# Patient Record
Sex: Male | Born: 1965
Health system: Southern US, Community
[De-identification: ages and names within clinical notes are randomized; demographics above are authoritative.]

## PROBLEM LIST (undated history)

## (undated) DIAGNOSIS — H5231 Anisometropia: Secondary | ICD-10-CM

## (undated) DIAGNOSIS — M419 Scoliosis, unspecified: Secondary | ICD-10-CM

## (undated) DIAGNOSIS — G809 Cerebral palsy, unspecified: Secondary | ICD-10-CM

## (undated) DIAGNOSIS — F72 Severe intellectual disabilities: Secondary | ICD-10-CM

## (undated) DIAGNOSIS — G40909 Epilepsy, unspecified, not intractable, without status epilepticus: Secondary | ICD-10-CM

## (undated) DIAGNOSIS — G4733 Obstructive sleep apnea (adult) (pediatric): Secondary | ICD-10-CM

## (undated) DIAGNOSIS — H5210 Myopia, unspecified eye: Secondary | ICD-10-CM

## (undated) DIAGNOSIS — G8250 Quadriplegia, unspecified: Secondary | ICD-10-CM

## (undated) DIAGNOSIS — G4736 Sleep related hypoventilation in conditions classified elsewhere: Secondary | ICD-10-CM

## (undated) HISTORY — DX: Severe intellectual disabilities: F72

## (undated) HISTORY — DX: Epilepsy, unspecified, not intractable, without status epilepticus: G40.909

## (undated) HISTORY — DX: Scoliosis, unspecified: M41.9

## (undated) HISTORY — DX: Quadriplegia, unspecified: G82.50

## (undated) HISTORY — DX: Sleep related hypoventilation in conditions classified elsewhere: G47.36

## (undated) HISTORY — DX: Obstructive sleep apnea (adult) (pediatric): G47.33

## (undated) HISTORY — DX: Anisometropia: H52.31

## (undated) HISTORY — DX: Myopia, unspecified eye: H52.10

## (undated) HISTORY — DX: Cerebral palsy, unspecified: G80.9

---

## 1998-03-26 ENCOUNTER — Encounter (HOSPITAL_COMMUNITY): Admission: RE | Admit: 1998-03-26 | Discharge: 1998-06-24 | Payer: Self-pay | Admitting: Family Medicine

## 2000-03-28 ENCOUNTER — Emergency Department (HOSPITAL_COMMUNITY): Admission: EM | Admit: 2000-03-28 | Discharge: 2000-03-28 | Payer: Self-pay | Admitting: *Deleted

## 2000-03-28 ENCOUNTER — Encounter: Payer: Self-pay | Admitting: Emergency Medicine

## 2001-07-06 ENCOUNTER — Encounter: Payer: Self-pay | Admitting: Emergency Medicine

## 2001-07-06 ENCOUNTER — Emergency Department (HOSPITAL_COMMUNITY): Admission: EM | Admit: 2001-07-06 | Discharge: 2001-07-06 | Payer: Self-pay | Admitting: *Deleted

## 2001-07-06 ENCOUNTER — Encounter: Payer: Self-pay | Admitting: Diagnostic Radiology

## 2002-08-14 ENCOUNTER — Encounter: Payer: Self-pay | Admitting: Emergency Medicine

## 2002-08-14 ENCOUNTER — Emergency Department (HOSPITAL_COMMUNITY): Admission: EM | Admit: 2002-08-14 | Discharge: 2002-08-15 | Payer: Self-pay | Admitting: Emergency Medicine

## 2005-01-09 ENCOUNTER — Emergency Department (HOSPITAL_COMMUNITY): Admission: EM | Admit: 2005-01-09 | Discharge: 2005-01-09 | Payer: Self-pay | Admitting: Family Medicine

## 2005-03-30 ENCOUNTER — Emergency Department (HOSPITAL_COMMUNITY): Admission: EM | Admit: 2005-03-30 | Discharge: 2005-03-30 | Payer: Self-pay | Admitting: Emergency Medicine

## 2006-06-22 ENCOUNTER — Emergency Department (HOSPITAL_COMMUNITY): Admission: EM | Admit: 2006-06-22 | Discharge: 2006-06-22 | Payer: Self-pay | Admitting: Family Medicine

## 2007-05-18 ENCOUNTER — Emergency Department (HOSPITAL_COMMUNITY): Admission: EM | Admit: 2007-05-18 | Discharge: 2007-05-18 | Payer: Self-pay | Admitting: Family Medicine

## 2007-12-28 ENCOUNTER — Ambulatory Visit (HOSPITAL_BASED_OUTPATIENT_CLINIC_OR_DEPARTMENT_OTHER): Admission: RE | Admit: 2007-12-28 | Discharge: 2007-12-28 | Payer: Self-pay | Admitting: Family Medicine

## 2007-12-31 ENCOUNTER — Ambulatory Visit: Payer: Self-pay | Admitting: Internal Medicine

## 2008-01-19 ENCOUNTER — Ambulatory Visit: Payer: Self-pay | Admitting: Pulmonary Disease

## 2008-01-19 DIAGNOSIS — G4733 Obstructive sleep apnea (adult) (pediatric): Secondary | ICD-10-CM

## 2008-01-24 ENCOUNTER — Telehealth: Payer: Self-pay | Admitting: Pulmonary Disease

## 2008-02-09 ENCOUNTER — Encounter: Payer: Self-pay | Admitting: Pulmonary Disease

## 2008-02-21 ENCOUNTER — Encounter: Admission: RE | Admit: 2008-02-21 | Discharge: 2008-02-21 | Payer: Self-pay | Admitting: Family Medicine

## 2008-02-27 ENCOUNTER — Encounter: Payer: Self-pay | Admitting: Pulmonary Disease

## 2008-02-28 ENCOUNTER — Encounter: Payer: Self-pay | Admitting: Pulmonary Disease

## 2008-03-11 ENCOUNTER — Encounter: Payer: Self-pay | Admitting: Pulmonary Disease

## 2008-03-13 ENCOUNTER — Ambulatory Visit: Payer: Self-pay | Admitting: Pulmonary Disease

## 2008-07-06 ENCOUNTER — Ambulatory Visit: Payer: Self-pay | Admitting: Pulmonary Disease

## 2008-08-20 ENCOUNTER — Encounter: Payer: Self-pay | Admitting: Pulmonary Disease

## 2008-08-30 ENCOUNTER — Encounter: Payer: Self-pay | Admitting: Pulmonary Disease

## 2008-11-16 ENCOUNTER — Encounter: Payer: Self-pay | Admitting: Pulmonary Disease

## 2009-01-17 ENCOUNTER — Encounter: Payer: Self-pay | Admitting: Pulmonary Disease

## 2009-07-08 ENCOUNTER — Ambulatory Visit: Payer: Self-pay | Admitting: Pulmonary Disease

## 2009-07-08 DIAGNOSIS — G4736 Sleep related hypoventilation in conditions classified elsewhere: Secondary | ICD-10-CM | POA: Insufficient documentation

## 2009-07-08 DIAGNOSIS — IMO0002 Reserved for concepts with insufficient information to code with codable children: Secondary | ICD-10-CM

## 2009-07-08 HISTORY — DX: Reserved for concepts with insufficient information to code with codable children: IMO0002

## 2009-07-11 ENCOUNTER — Encounter: Payer: Self-pay | Admitting: Pulmonary Disease

## 2009-08-12 ENCOUNTER — Telehealth (INDEPENDENT_AMBULATORY_CARE_PROVIDER_SITE_OTHER): Payer: Self-pay | Admitting: *Deleted

## 2009-08-15 ENCOUNTER — Encounter: Payer: Self-pay | Admitting: Pulmonary Disease

## 2009-08-24 ENCOUNTER — Encounter: Payer: Self-pay | Admitting: Pulmonary Disease

## 2009-09-07 ENCOUNTER — Encounter: Payer: Self-pay | Admitting: Pulmonary Disease

## 2009-09-12 ENCOUNTER — Encounter: Payer: Self-pay | Admitting: Pulmonary Disease

## 2009-10-01 ENCOUNTER — Ambulatory Visit: Payer: Self-pay | Admitting: Pulmonary Disease

## 2009-10-04 ENCOUNTER — Encounter: Payer: Self-pay | Admitting: Pulmonary Disease

## 2009-10-10 ENCOUNTER — Encounter: Payer: Self-pay | Admitting: Pulmonary Disease

## 2009-10-15 ENCOUNTER — Telehealth (INDEPENDENT_AMBULATORY_CARE_PROVIDER_SITE_OTHER): Payer: Self-pay | Admitting: *Deleted

## 2009-11-04 ENCOUNTER — Telehealth (INDEPENDENT_AMBULATORY_CARE_PROVIDER_SITE_OTHER): Payer: Self-pay | Admitting: *Deleted

## 2010-01-14 ENCOUNTER — Encounter: Payer: Self-pay | Admitting: Pulmonary Disease

## 2010-02-09 ENCOUNTER — Encounter: Payer: Self-pay | Admitting: Pulmonary Disease

## 2010-03-05 ENCOUNTER — Ambulatory Visit: Payer: Self-pay | Admitting: Pulmonary Disease

## 2010-05-28 ENCOUNTER — Encounter: Payer: Self-pay | Admitting: Pulmonary Disease

## 2010-08-26 ENCOUNTER — Ambulatory Visit: Payer: Self-pay | Admitting: Pulmonary Disease

## 2010-09-03 ENCOUNTER — Encounter: Payer: Self-pay | Admitting: Pulmonary Disease

## 2010-09-10 ENCOUNTER — Encounter: Payer: Self-pay | Admitting: Pulmonary Disease

## 2010-09-23 ENCOUNTER — Telehealth (INDEPENDENT_AMBULATORY_CARE_PROVIDER_SITE_OTHER): Payer: Self-pay | Admitting: *Deleted

## 2010-10-07 ENCOUNTER — Encounter: Payer: Self-pay | Admitting: Pulmonary Disease

## 2010-10-08 ENCOUNTER — Encounter: Payer: Self-pay | Admitting: Pulmonary Disease

## 2010-11-03 ENCOUNTER — Encounter: Payer: Self-pay | Admitting: Pulmonary Disease

## 2010-11-12 ENCOUNTER — Encounter: Payer: Self-pay | Admitting: Pulmonary Disease

## 2010-11-19 ENCOUNTER — Encounter: Payer: Self-pay | Admitting: Pulmonary Disease

## 2010-11-27 NOTE — Letter (Signed)
Summary: CMN for Oxygen/[facility]  CMN for Oxygen/[facility]   Imported By: Sherian Rein 11/21/2010 07:39:07  _____________________________________________________________________  External Attachment:    Type:   Image     Comment:   External Document

## 2010-11-27 NOTE — Letter (Signed)
Summary: CMN for Oximetry Test/[facility]  CMN for Oximetry Test/[facility]   Imported By: Sherian Rein 09/08/2010 11:49:22  _____________________________________________________________________  External Attachment:    Type:   Image     Comment:   External Document

## 2010-11-27 NOTE — Progress Notes (Signed)
Summary: ORDER  Phone Note From Other Clinic Call back at [phone]   Caller: [facility] Call For: SOOD Summary of Call: HAVE QUESTIONS ABOUT PULSE OX BEING DISCONTINUED Initial call taken by: Rickard Patience,  November 04, 2009 12:47 PM  Follow-up for Phone Call        lmtcb   Philipp Deputy [facility]  November 04, 2009 1:24 PM  April returned call.  Please call her back.  Follow-up by: Eugene Gavia,  November 06, 2009 2:53 PM  Additional Follow-up for Phone Call Additional follow up Details #1::        pt only had pulse ox while having the ono done with [facility] pt does not need one all the time and dr sood did not order this for all the time Additional Follow-up by: Philipp Deputy CMA,  November 06, 2009 3:04 PM

## 2010-11-27 NOTE — Progress Notes (Signed)
Summary: overnight oximetry and sleep study  Phone Note From Other Clinic Call back at [phone]   Caller: Joyce Gross w/ [facility] for Dr. Dione Housekeeper Call For: VS Summary of Call: pt had overnight oximetry done 11.16.11.  requesting copy of this and a sleep study be faxed for their records. fax [phone] attn: Dr. Ronnie Derby.  i see the overnight oximetry but not a sleep study.   Initial call taken by: Boone Master CNA/MA,  September 23, 2010 8:56 AM  Follow-up for Phone Call        LM for Joyce Gross to call back.  Dr Evlyn Courier oroginal sleep consult note reports that pt had split night study at [facility] in 12/2007.  Not sure if she wants this study sent.  WE would have to request this from [facility]. Abigail Miyamoto RN  September 23, 2010 9:17 AM   Additional Follow-up for Phone Call Additional follow up Details #1::        Spoke with Joyce Gross at [facility].  She requested that we just send the overnight ox results.  Results faxed. Abigail Miyamoto RN  September 23, 2010 9:27 AM

## 2010-11-27 NOTE — Letter (Signed)
Summary: CMN for CPAP Supplies/[facility]  CMN for CPAP Supplies/[facility]   Imported By: Sherian Rein 02/18/2010 11:11:53  _____________________________________________________________________  External Attachment:    Type:   Image     Comment:   External Document

## 2010-11-27 NOTE — Assessment & Plan Note (Signed)
Summary: rov//mbw   Visit Type:  Follow-up Primary Provider/Referring Provider:  Dione Housekeeper  CC:  OSA. Patient is doing fairly well on CPAP. He had a new mask. He likes to sleep on his side or stomach and his eyes are red every morning due to air from the mask.Marland Kitchen  History of Present Illness: I saw Richard Oliver in follow up for his severe OSA on CPAP 17 cm H2O with 4 liters oxgen at night.  He has been sleeping well, and more energetic during the day.  His parents have noticed that he sleeps on his stomach, and they are concerned his mask gets displaced.  As a result he has air blowing in his eyes, and he wakes up with red eyes.  He is due to see the eye doctor later this month.   Current Medications (verified): 1)  Periogard 0.12 %  Soln (Chlorhexidine Gluconate) .... As Directed 2)  Fluticasone Propionate 50 Mcg/act Susp (Fluticasone Propionate) .... 2 Sprays in Each Nostril Daily 3)  Phenobarbital 100 Mg  Tabs (Phenobarbital) .Marland Kitchen.. 1 Tab Two Times A Day 4)  Xalatan 0.005 %  Soln (Latanoprost) .... As Directed For Eyes 5)  Bisacodyl 10 Mg  Supp (Bisacodyl) .... Once Daily As Needed 6)  Triazolam 0.125 Mg  Tabs (Triazolam) .Marland Kitchen.. 1 Tab By Mouth B4 Dental Appt 7)  Combigan 0.2-0.5 % Soln (Brimonidine Tartrate-Timolol) .... As Directed 8)  Deep Sea Nasal Spray 0.65 % Soln (Saline) .... As Directed 9)  Ketotifen Fumarate 0.025 % Soln (Ketotifen Fumarate) .... As Directed 10)  Chlorophyll 3-0.6 Mg Tabs (Chlorophyll-Thymol) .Marland Kitchen.. 1 By Mouth Two Times A Day  Allergies (verified): No Known Drug Allergies  Past History:  Past Medical History: Last updated: 10/01/2009 Cerebral Palsy Severe Mental Retardation Glaucoma Anisometropia Myopia Quadraplegia Scoliosis Seizure Disorder Severe OSA      - PSG 12/28/07 AHI 35      - CPAP 17 cm H2O with 4 liters oxygen  Social History: Last updated: 01/19/2008 Lives in an assisted living center.  Single, no children.  No history of alcohol or  tobacco use.  Vital Signs:  Patient profile:   45 year old male O2 Sat:      98 % on Room air Temp:     97.7 degrees F (36.50 degrees C) oral Pulse rate:   78 / minute BP sitting:   120 / 78  (left arm) Cuff size:   regular  Vitals Entered By: Michel Bickers CMA (Mar 05, 2010 9:50 AM)  O2 Sat at Rest %:  98 O2 Flow:  Room air  Physical Exam  Eyes:  conjunctival injection.   Nose:  Narrow nasal angle, no drainage Mouth:  Retrognathic, high arched palate, narrow jaw, enlarged tongue Neck:  no LAN Lungs:  clear bilaterally to auscultation and percussion Heart:  regular rate and rhythm, S1, S2 without murmurs, rubs, gallops, or clicks Extremities:  no clubbing, cyanosis, edema, or deformity noted   Impression & Recommendations:  Problem # 1:  OBSTRUCTIVE SLEEP APNEA (ICD-327.23) Will continue CPAP 17 cm.  Problem # 2:  HYPOXEMIA (ICD-799.02) He is to continue on 4 liters O2 at night with CPAP.  Medications Added to Medication List This Visit: 1)  Bisacodyl 10 Mg Supp (Bisacodyl) .... Once daily as needed  Complete Medication List: 1)  Periogard 0.12 % Soln (Chlorhexidine gluconate) .... As directed 2)  Fluticasone Propionate 50 Mcg/act Susp (Fluticasone propionate) .... 2 sprays in each nostril daily 3)  Phenobarbital 100 Mg  Tabs (Phenobarbital) .Marland Kitchen.. 1 tab two times a day 4)  Xalatan 0.005 % Soln (Latanoprost) .... As directed for eyes 5)  Bisacodyl 10 Mg Supp (Bisacodyl) .... Once daily as needed 6)  Triazolam 0.125 Mg Tabs (Triazolam) .Marland Kitchen.. 1 tab by mouth b4 dental appt 7)  Combigan 0.2-0.5 % Soln (Brimonidine tartrate-timolol) .... As directed 8)  Deep Sea Nasal Spray 0.65 % Soln (Saline) .... As directed 9)  Ketotifen Fumarate 0.025 % Soln (Ketotifen fumarate) .... As directed 10)  Chlorophyll 3-0.6 Mg Tabs (Chlorophyll-thymol) .Marland Kitchen.. 1 by mouth two times a day  Other Orders: Est. Patient Level III (29562) DME Referral (DME)  Patient Instructions: 1)  Will have home  care company check CPAP mask fit 2)  Follow up in 6 months

## 2010-11-27 NOTE — Procedures (Signed)
Summary: Oximetry / [facility]  Oximetry / [facility]   Imported By: Lennie Odor 09/22/2010 12:44:35  _____________________________________________________________________  External Attachment:    Type:   Image     Comment:   External Document

## 2010-11-27 NOTE — Letter (Signed)
Summary: CMN for Supplies/[facility]  CMN for Supplies/[facility]   Imported By: Lennie Odor 06/03/2010 14:34:28  _____________________________________________________________________  External Attachment:    Type:   Image     Comment:   External Document

## 2010-11-27 NOTE — Letter (Signed)
Summary: CMN/[facility]  CMN/[facility]   Imported By: Lester Bexar 09/22/2010 09:44:33  _____________________________________________________________________  External Attachment:    Type:   Image     Comment:   External Document

## 2010-11-27 NOTE — Letter (Signed)
Summary: CMN for CPAP Supplies/[facility]  CMN for CPAP Supplies/[facility]   Imported By: Sherian Rein 10/15/2010 09:39:09  _____________________________________________________________________  External Attachment:    Type:   Image     Comment:   External Document

## 2010-11-27 NOTE — Letter (Signed)
Summary: CMN for Pulse Oximeter/[facility]  CMN for Pulse Oximeter/[facility]   Imported By: Sherian Rein 01/17/2010 12:04:23  _____________________________________________________________________  External Attachment:    Type:   Image     Comment:   External Document

## 2010-11-27 NOTE — Progress Notes (Signed)
Summary: waiting to hear re: ono w/ [facility]  Phone Note From Other Clinic   Caller: kay w/ [facility] Call For: sood Summary of Call: caller says that they have not yet heard from [facility] re: ono to be set up. call either trian or mena (nurses at [facility] w/ info after talking to [facility] and advise. [phone] Initial call taken by: Tivis Ringer,  August 12, 2009 3:25 PM  Follow-up for Phone Call        [facility] checking on this and will call back.Michel Bickers [facility]  August 12, 2009 3:54 PM  Per Dennard Schaumann at [facility], order was never received at their office. I will send this to our PCC's to have this resent to [facility] and let [facility] know this also. Libby or [place], [facility] did not get this order. Please resend. Thanks. Follow-up by: Michel Bickers CMA,  August 12, 2009 4:10 PM  Additional Follow-up for Phone Call Additional follow up Details #1::        spoke to lecretia she will check to see what happened to the order in sept and i will give her a copy to flu with  Additional Follow-up by: Oneita Jolly,  August 12, 2009 4:54 PM

## 2010-11-27 NOTE — Letter (Signed)
Summary: CMN for Oxygen / [facility]  CMN for Oxygen / [facility]   Imported By: Lennie Odor 11/11/2010 11:30:06  _____________________________________________________________________  External Attachment:    Type:   Image     Comment:   External Document

## 2010-11-27 NOTE — Assessment & Plan Note (Signed)
Summary: f/u ///kp   Visit Type:  Follow-up Primary Aveyah Greenwood/Referring Ashelynn Marks:  Dione Housekeeper  CC:  OSA follow-up...mask is fitting well...patient says he did not sleep well last night.  History of Present Illness: I saw Mr. Haberman in follow up for his severe OSA on CPAP 17 cm H2O with 4 liters oxgen at night.  He has been doing better since his mask was refit.  He uses his mask on most nights, and uses oxygen with this.  He seems alert according to the staff at his home.  He has not complained of sinus congestion, sore throat, or cough.  Current Medications (verified): 1)  Periogard 0.12 %  Soln (Chlorhexidine Gluconate) .... As Directed 2)  Fluticasone Propionate 50 Mcg/act Susp (Fluticasone Propionate) .... 2 Sprays in Each Nostril Daily 3)  Phenobarbital 100 Mg  Tabs (Phenobarbital) .Marland Kitchen.. 1 Tab Two Times A Day 4)  Xalatan 0.005 %  Soln (Latanoprost) .... As Directed For Eyes 5)  Bisacodyl 10 Mg  Supp (Bisacodyl) .... Once Daily As Needed 6)  Triazolam 0.125 Mg  Tabs (Triazolam) .Marland Kitchen.. 1 Tab By Mouth B4 Dental Appt 7)  Deep Sea Nasal Spray 0.65 % Soln (Saline) .... As Directed 8)  Ketotifen Fumarate 0.025 % Soln (Ketotifen Fumarate) .... As Directed 9)  Chlorophyll 3-0.6 Mg Tabs (Chlorophyll-Thymol) .Marland Kitchen.. 1 By Mouth Two Times A Day 10)  Tinactin 1 % Aero (Tolnaftate) .... As Directed 11)  Genteal Pm 85-15 % Oint (White Petrolatum-Mineral Oil) .... As Directed At Bedtime 12)  Zyrtec Allergy 10 Mg Caps (Cetirizine Hcl) .Marland Kitchen.. 1 By Mouth Daily  Allergies (verified): No Known Drug Allergies  Past History:  Past Medical History: Reviewed history from 10/01/2009 and no changes required. Cerebral Palsy Severe Mental Retardation Glaucoma Anisometropia Myopia Quadraplegia Scoliosis Seizure Disorder Severe OSA      - PSG 12/28/07 AHI 35      - CPAP 17 cm H2O with 4 liters oxygen  Vital Signs:  Patient profile:   45 year old male Height:      62 inches (157.48 cm) O2 Sat:      98  % on Room air Temp:     98.0 degrees F (36.67 degrees C) oral Pulse rate:   81 / minute BP sitting:   124 / 86  (right arm) Cuff size:   regular  Vitals Entered By: Michel Bickers CMA (August 26, 2010 8:52 AM)  O2 Sat at Rest %:  98 O2 Flow:  Room air CC: OSA follow-up...mask is fitting well...patient says he did not sleep well last night Comments Medications reviewed with patient Michel Bickers CMA  August 26, 2010 9:00 AM   Physical Exam  Nose:  Narrow nasal angle, no drainage Mouth:  Retrognathic, high arched palate, narrow jaw, enlarged tongue Neck:  no LAN Lungs:  clear bilaterally to auscultation and percussion Heart:  regular rate and rhythm, S1, S2 without murmurs, rubs, gallops, or clicks Extremities:  no clubbing, cyanosis, edema, or deformity noted   Impression & Recommendations:  Problem # 1:  OBSTRUCTIVE SLEEP APNEA (ICD-327.23)  Will continue CPAP 17 cm.  Problem # 2:  HYPOXEMIA (ICD-799.02)  He is to continue on 4 liters O2 at night with CPAP.  Medications Added to Medication List This Visit: 1)  Tinactin 1 % Aero (Tolnaftate) .... As directed 2)  Genteal Pm 85-15 % Oint (White petrolatum-mineral oil) .... As directed at bedtime 3)  Zyrtec Allergy 10 Mg Caps (Cetirizine hcl) .Marland Kitchen.. 1 by mouth  daily  Complete Medication List: 1)  Periogard 0.12 % Soln (Chlorhexidine gluconate) .... As directed 2)  Fluticasone Propionate 50 Mcg/act Susp (Fluticasone propionate) .... 2 sprays in each nostril daily 3)  Phenobarbital 100 Mg Tabs (Phenobarbital) .Marland Kitchen.. 1 tab two times a day 4)  Xalatan 0.005 % Soln (Latanoprost) .... As directed for eyes 5)  Bisacodyl 10 Mg Supp (Bisacodyl) .... Once daily as needed 6)  Triazolam 0.125 Mg Tabs (Triazolam) .Marland Kitchen.. 1 tab by mouth b4 dental appt 7)  Deep Sea Nasal Spray 0.65 % Soln (Saline) .... As directed 8)  Ketotifen Fumarate 0.025 % Soln (Ketotifen fumarate) .... As directed 9)  Chlorophyll 3-0.6 Mg Tabs (Chlorophyll-thymol) .Marland Kitchen.. 1  by mouth two times a day 10)  Tinactin 1 % Aero (Tolnaftate) .... As directed 11)  Genteal Pm 85-15 % Oint (White petrolatum-mineral oil) .... As directed at bedtime 12)  Zyrtec Allergy 10 Mg Caps (Cetirizine hcl) .Marland Kitchen.. 1 by mouth daily  Other Orders: Est. Patient Level III (16109)  Patient Instructions: 1)  Follow up in one year

## 2010-11-27 NOTE — Procedures (Signed)
Summary: Oximetry/[facility]  Oximetry/[facility] [place]   Imported By: Sherian Rein 11/14/2010 10:49:24  _____________________________________________________________________  External Attachment:    Type:   Image     Comment:   External Document

## 2010-11-27 NOTE — Letter (Signed)
Summary: CMN for CPAP Supplies/[facility]  CMN for CPAP Supplies/[facility]   Imported By: Sherian Rein 10/10/2010 14:06:40  _____________________________________________________________________  External Attachment:    Type:   Image     Comment:   External Document

## 2010-12-03 NOTE — Letter (Signed)
Summary: CMN/[facility]  CMN/[facility]   Imported By: Lester York 11/26/2010 10:51:04  _____________________________________________________________________  External Attachment:    Type:   Image     Comment:   External Document

## 2011-03-10 NOTE — Procedures (Signed)
NAME:  Richard Oliver, Richard Oliver                 ACCOUNT NO.:  [id number]   MEDICAL RECORD NO.:  [id number]          PATIENT TYPE:  OUT   LOCATION:  SLEEP CENTER                 FACILITY:  [facility]   PHYSICIAN:  Clinton D. Maple Hudson, MD, FCCP, FACPDATE OF BIRTH:  Jul 12, 1968   DATE OF STUDY:  12/28/2007                            NOCTURNAL POLYSOMNOGRAM   REFERRING PHYSICIAN:  Dione Housekeeper, M.D.   INDICATION FOR STUDY:  Hypersomnia with sleep apnea.   EPWORTH SLEEPINESS SCORE:  14/24, BMI 27.3, weight 149 pounds, height 62  inches. Neck 16 inches.   MEDICATIONS:  Home medication charted and reviewed.  Note - this patient  has cerebral palsy, seizure disorder and required one on one assistance.   SLEEP ARCHITECTURE:  Total sleep time 365 minutes with sleep efficiency  98%.  Stage 1 was 3.7%, stage II 89.3%, stage III absent, REM 7% of  total sleep time.  Sleep latency 1 minute, REM latency 239 minutes, awake after sleep onset  6.5 minutes, arousal index 15.6.  No bedtime medication taken.   RESPIRATORY DATA:  Apnea/hypopnea index (AHI) 34.8 indicating moderately  severe obstructive sleep apnea/hypopnea syndrome.  There were 212 events  scored, including 154 obstructive apnea's, 12 central apnea's, and 46  hypopnea's.  Events were not positional but more common while not  supine. REM AHI 7.1.  CPAP was titrated to 17 CWP, AHI 4.7 per hour.  A  medium Quattro full face mask was used with heated humidifier.   OXYGEN DATA:  Loud snoring with oxygen desaturation to a nadir of 74%  before CPAP. Mean oxygen saturation was held at 91.9% on room air.   CARDIAC DATA:  Normal sinus rhythm.   MOVEMENT-PARASOMNIA:  No limb movement disturbance, no bathroom trips.   IMPRESSIONS-RECOMMENDATIONS:  1. Moderately severe obstructive sleep apnea/hypopnea syndrome, AHI      34.8 overall and 56.2 before CPAP with loud snoring and oxygen      desaturation to a nadir of 74%.  2. Successful CPAP titration to 17 CWP,  AHI 4.7 per hour.  A medium      Quattro full face mask was used with heated humidifier.      Clinton D. Maple Hudson, MD, [facility], FACP  Diplomate, Biomedical engineer of Sleep Medicine  Electronically Signed     CDY/MEDQ  D:  12/31/2007 16:53:39  T:  01/01/2008 14:39:27  Job:  [id number]

## 2011-04-09 ENCOUNTER — Telehealth: Payer: Self-pay | Admitting: Pulmonary Disease

## 2011-04-09 DIAGNOSIS — G4733 Obstructive sleep apnea (adult) (pediatric): Secondary | ICD-10-CM

## 2011-04-09 NOTE — Telephone Encounter (Signed)
[email or web address].Pebble Botkin, CMA  

## 2011-04-10 ENCOUNTER — Telehealth: Payer: Self-pay | Admitting: Pulmonary Disease

## 2011-04-10 DIAGNOSIS — G4733 Obstructive sleep apnea (adult) (pediatric): Secondary | ICD-10-CM

## 2011-04-10 NOTE — Telephone Encounter (Signed)
Noted  

## 2011-04-10 NOTE — Telephone Encounter (Signed)
Spoke with Nurse at [facility] care ctr. She states that pt having difficulty with being compliant with CPAP. He is c/o mask hurting his nose, too much pressure from CPAP- set on 19 currently. He sleeps on his knees facing down in fetal position and this does not seem to helps matters. VS out of the office until 04/22/11 so will forward to RA. Please advise thanks!

## 2011-04-10 NOTE — Telephone Encounter (Signed)
Called and spoke with nurse,TreeAnne, and informed her of RA's recs.  Order sent to [facility] to have pt's DME company, [facility], adjust pressure and get download in 2 weeks.

## 2011-04-10 NOTE — Telephone Encounter (Signed)
Lower pressure to 15 cm  Get download in 2 weeks for VS.

## 2011-04-13 NOTE — Telephone Encounter (Signed)
Pt currently on CPAP set on 17 with 4l/m oxygen; per CY-okay to turn CPAP down to 15 with 4l/m oxygen. I spoke with nurse-aware order has been placed to have [facility] turn CPAP down.

## 2011-04-13 NOTE — Telephone Encounter (Signed)
Spoke with nurse-states that she has changed the mask for the patient but he feels like it may be the pressure as he isn't keeping the mask on. Was wondering if pressure can be checked through [facility]. VS out of the office until 04-22-11; please advise.

## 2011-04-14 ENCOUNTER — Other Ambulatory Visit: Payer: Self-pay | Admitting: Pulmonary Disease

## 2011-04-14 DIAGNOSIS — G4733 Obstructive sleep apnea (adult) (pediatric): Secondary | ICD-10-CM

## 2011-09-21 ENCOUNTER — Encounter: Payer: Self-pay | Admitting: Pulmonary Disease

## 2011-09-22 ENCOUNTER — Ambulatory Visit (INDEPENDENT_AMBULATORY_CARE_PROVIDER_SITE_OTHER): Payer: Medicare Other | Admitting: Pulmonary Disease

## 2011-09-22 ENCOUNTER — Encounter: Payer: Self-pay | Admitting: Pulmonary Disease

## 2011-09-22 DIAGNOSIS — G4733 Obstructive sleep apnea (adult) (pediatric): Secondary | ICD-10-CM

## 2011-09-22 DIAGNOSIS — G4736 Sleep related hypoventilation in conditions classified elsewhere: Secondary | ICD-10-CM

## 2011-09-22 DIAGNOSIS — G4739 Other sleep apnea: Secondary | ICD-10-CM

## 2011-09-22 NOTE — Assessment & Plan Note (Signed)
He is to continue with 4 liters oxygen with CPAP.     

## 2011-09-22 NOTE — Assessment & Plan Note (Signed)
He is to continue using CPAP at 17 cm H2O.  Advised he could try using an adhesive strip over his nose at night with CPAP mask.

## 2011-09-22 NOTE — Patient Instructions (Signed)
Follow up in 6 months 

## 2011-09-22 NOTE — Progress Notes (Signed)
Chief Complaint  Patient presents with  . Follow-up    Pt wears cpap everynight w/ oxygen at night. Pt states the mask makes marks on his nose.    History of Present Illness: Richard Oliver is a 45 y.o. male with severe OSA/OHS on CPAP 17 cm H2O with 4 liters oxygen.  He has some irritation over his nose from using CPAP.    His caregiver reports that he uses his CPAP and oxygen on a regular basis.  He is alert during the day, and able to participate in group home activities w/o complaints of falling asleep during the day.  He has pulse oximetry done at night while using oxygen and CPAP, and there have been no reports of trouble with his set up.  Past Medical History  Diagnosis Date  . Cerebral palsy   . Severe mental retardation   . Glaucoma   . Anisometropia   . Myopia   . Quadriplegia   . Scoliosis   . Seizure disorder   . OSA (obstructive sleep apnea)   . Sleep-related hypoventilation 07/08/2009    No past surgical history on file.  Current Outpatient Prescriptions on File Prior to Visit  Medication Sig Dispense Refill  . bisacodyl (DULCOLAX) 10 MG suppository Place 10 mg rectally as needed.        . chlorhexidine (PERIDEX) 0.12 % solution as directed.        . Chlorophyll 3-0.6 MG TABS Take 1 tablet by mouth 2 (two) times daily.        . fluticasone (FLONASE) 50 MCG/ACT nasal spray Place 2 sprays into the nose daily.        Marland Kitchen ketotifen (ZADITOR) 0.025 % ophthalmic solution as directed.        . latanoprost (XALATAN) 0.005 % ophthalmic solution as directed.        . sodium chloride (OCEAN) 0.65 % nasal spray as directed.        Cliffton Asters Petrolatum-Mineral Oil (GENTEAL PM) 85-15 % OINT Apply to eye as directed.          No Known Allergies  Physical Exam:  Blood pressure 122/76, pulse 76, temperature 98 F (36.7 C), temperature source Oral, height 5\' 2"  (1.575 m), weight 157 lb (71.215 kg), SpO2 97.00%.  General - In wheelchair HEENT - Wear glasses, mild irritation over  bridge of nose, narrow nasal angle, retrognathic, high arched palate, enlarged tongue Cardiac - s1s2 regular, no murmur Chest - no wheeze/rales Abdomen - soft, nontender Extremities - no edema Skin - no rashes Neurologic - follows simple commands   Assessment/Plan:  OBSTRUCTIVE SLEEP APNEA He is to continue using CPAP at 17 cm H2O.  Advised he could try using an adhesive strip over his nose at night with CPAP mask.  Sleep-related hypoventilation He is to continue with 4 liters oxygen with CPAP.     Outpatient Encounter Prescriptions as of 09/22/2011  Medication Sig Dispense Refill  . bisacodyl (DULCOLAX) 10 MG suppository Place 10 mg rectally as needed.        . brimonidine (ALPHAGAN) 0.15 % ophthalmic solution 1 drop right eye twice a day       . chlorhexidine (PERIDEX) 0.12 % solution as directed.        . Chlorophyll 3-0.6 MG TABS Take 1 tablet by mouth 2 (two) times daily.        Marland Kitchen docusate sodium (COLACE) 50 MG capsule 1 every evening       . fluticasone (FLONASE) 50  MCG/ACT nasal spray Place 2 sprays into the nose daily.        Marland Kitchen ketotifen (ZADITOR) 0.025 % ophthalmic solution as directed.        . latanoprost (XALATAN) 0.005 % ophthalmic solution as directed.        . Multiple Vitamin (MULTIVITAMIN) tablet Take 1 tablet by mouth daily.        . Polyethylene Glycol 400 (BLINK TEARS OP) 1 drop both eyes 4 times a day as needed       . sodium chloride (OCEAN) 0.65 % nasal spray as directed.        . sulfamethoxazole-trimethoprim (BACTRIM DS) 800-160 MG per tablet Take 1 tablet by mouth 2 (two) times daily.        . timolol (BETIMOL) 0.5 % ophthalmic solution 1 drop right eye every morning       . White Petrolatum-Mineral Oil (GENTEAL PM) 85-15 % OINT Apply to eye as directed.        Marland Kitchen DISCONTD: cetirizine (ZYRTEC) 10 MG tablet Take 10 mg by mouth daily.        Marland Kitchen DISCONTD: PHENObarbital (LUMINAL) 100 MG tablet Take 100 mg by mouth 2 (two) times daily.        Marland Kitchen DISCONTD:  tolnaftate (TINACTIN) 1 % spray as directed.        Marland Kitchen DISCONTD: triazolam (HALCION) 0.125 MG tablet Take 0.125 mg by mouth as directed. Before dental appointment         Richard Oliver Pager:  [phone] 09/22/2011, 9:49 AM

## 2012-03-14 ENCOUNTER — Ambulatory Visit: Payer: Medicare Other | Admitting: Pulmonary Disease

## 2012-04-01 ENCOUNTER — Ambulatory Visit (INDEPENDENT_AMBULATORY_CARE_PROVIDER_SITE_OTHER): Payer: Medicare Other | Admitting: Pulmonary Disease

## 2012-04-01 ENCOUNTER — Encounter: Payer: Self-pay | Admitting: Pulmonary Disease

## 2012-04-01 VITALS — BP 138/92 | HR 73 | Temp 97.5°F | Ht 65.0 in

## 2012-04-01 DIAGNOSIS — G4739 Other sleep apnea: Secondary | ICD-10-CM

## 2012-04-01 DIAGNOSIS — G4733 Obstructive sleep apnea (adult) (pediatric): Secondary | ICD-10-CM

## 2012-04-01 DIAGNOSIS — G4736 Sleep related hypoventilation in conditions classified elsewhere: Secondary | ICD-10-CM

## 2012-04-01 NOTE — Assessment & Plan Note (Signed)
He is to continue using CPAP at 17 cm H2O.  Will have his DME adjust his mask.

## 2012-04-01 NOTE — Patient Instructions (Signed)
Will have your home care company re-assess CPAP mask fit Follow up in one year

## 2012-04-01 NOTE — Assessment & Plan Note (Signed)
He is to continue with 4 liters oxygen with CPAP.     

## 2012-04-01 NOTE — Progress Notes (Signed)
Chief Complaint  Patient presents with  . Follow-up    According to pt he does not wear his cpap mask everynight. but does wears his oxygen everynight    History of Present Illness: Campbell Kray is a 46 y.o. male with severe OSA/OHS on CPAP 17 cm H2O with 4 liters oxygen.  He reports using his CPAP intermittently.  He does not like the way the mask fits over his nose.  He is using his oxygen.  His companion does not work at his home, and is not able to provide additional information about his sleep.  Past Medical History  Diagnosis Date  . Cerebral palsy   . Severe mental retardation   . Glaucoma   . Anisometropia   . Myopia   . Quadriplegia   . Scoliosis   . Seizure disorder   . OSA (obstructive sleep apnea)   . Sleep-related hypoventilation 07/08/2009    No past surgical history on file.  Current Outpatient Prescriptions on File Prior to Visit  Medication Sig Dispense Refill  . bisacodyl (DULCOLAX) 10 MG suppository Place 10 mg rectally as needed.        . brimonidine (ALPHAGAN) 0.15 % ophthalmic solution 1 drop right eye twice a day       . chlorhexidine (PERIDEX) 0.12 % solution as directed.        . Chlorophyll 3-0.6 MG TABS Take 1 tablet by mouth 2 (two) times daily.        Marland Kitchen docusate sodium (COLACE) 50 MG capsule 1 every evening       . fluticasone (FLONASE) 50 MCG/ACT nasal spray Place 2 sprays into the nose daily.        Marland Kitchen ketotifen (ZADITOR) 0.025 % ophthalmic solution as directed.        . latanoprost (XALATAN) 0.005 % ophthalmic solution as directed.        . Multiple Vitamin (MULTIVITAMIN) tablet Take 1 tablet by mouth daily.        Marland Kitchen PHENobarbital (LUMINAL) 97.2 MG tablet Take 97.2 mg by mouth 2 (two) times daily.      . Polyethylene Glycol 400 (BLINK TEARS OP) 1 drop both eyes 4 times a day as needed       . sodium chloride (OCEAN) 0.65 % nasal spray as directed.        . timolol (BETIMOL) 0.5 % ophthalmic solution 1 drop right eye every morning       . White  Petrolatum-Mineral Oil (GENTEAL PM) 85-15 % OINT Apply to eye as directed.          No Known Allergies  Physical Exam:  Blood pressure 138/92, pulse 73, temperature 97.5 F (36.4 C), temperature source Oral, height 5\' 5"  (1.651 m), SpO2 95.00%.  General - In wheelchair HEENT - narrow nasal angle, retrognathic, high arched palate, enlarged tongue Cardiac - s1s2 regular, no murmur Chest - no wheeze/rales Abdomen - soft, nontender Extremities - no edema Skin - no rashes Neurologic - follows simple commands   Assessment/Plan:  Devonne Lalani Pager:  [phone] 04/01/2012, 11:13 AM

## 2013-01-16 ENCOUNTER — Telehealth: Payer: Self-pay | Admitting: Pulmonary Disease

## 2013-01-16 DIAGNOSIS — E662 Morbid (severe) obesity with alveolar hypoventilation: Secondary | ICD-10-CM

## 2013-01-16 DIAGNOSIS — G4733 Obstructive sleep apnea (adult) (pediatric): Secondary | ICD-10-CM

## 2013-01-16 NOTE — Telephone Encounter (Signed)
I spoke with Melissa. RT went out to pt home to troubleshoot pt CPAP. RT states it is the motor getting ready to go out. Pt is due for new machine 01/23/13. They will need new order for machine/supplies and will need OV per medicare guidelines. Please advise Dr. Craige Cotta. Nothing available until mid April. thanks

## 2013-01-17 NOTE — Telephone Encounter (Signed)
I spoke with kay. She schedules pt appts since he is in a home and he is scheduled for 02/02/13 at 9:15. Nothing further was needed

## 2013-01-17 NOTE — Telephone Encounter (Signed)
lmomtcb x1 for pt 

## 2013-01-17 NOTE — Telephone Encounter (Signed)
Please inform pt that I have placed order for new machine, but that he may not be able to get new machine until after ROV.  Please schedule him for next available ROV in April.

## 2013-02-02 ENCOUNTER — Encounter: Payer: Self-pay | Admitting: Pulmonary Disease

## 2013-02-02 ENCOUNTER — Ambulatory Visit (INDEPENDENT_AMBULATORY_CARE_PROVIDER_SITE_OTHER): Payer: Medicare Other | Admitting: Pulmonary Disease

## 2013-02-02 VITALS — BP 118/74 | HR 80 | Temp 96.9°F | Wt 159.0 lb

## 2013-02-02 DIAGNOSIS — G4736 Sleep related hypoventilation in conditions classified elsewhere: Secondary | ICD-10-CM

## 2013-02-02 DIAGNOSIS — G4733 Obstructive sleep apnea (adult) (pediatric): Secondary | ICD-10-CM

## 2013-02-02 DIAGNOSIS — G4739 Other sleep apnea: Secondary | ICD-10-CM

## 2013-02-02 NOTE — Patient Instructions (Signed)
Will arrange for new CPAP machine and CPAP mask refit Follow up in 6 months

## 2013-02-02 NOTE — Progress Notes (Signed)
Chief Complaint  Patient presents with  . Follow-up    Pt wears it everynight but take its off every now and then. he also wears oxyegn at night as well.     History of Present Illness: Richard Oliver is a 47 y.o. male severe OSA/OHS on CPAP 17 cm H2O with 4 liters oxygen.  He has trouble using his machine.  He does not like the noise it makes.  He also c/o soreness from his mask.  He is accompanied by attendant from his residential facility.  TESTS: PSG 12/28/07 >> AHI 35   Richard Oliver  has a past medical history of Cerebral palsy; Severe mental retardation; Glaucoma(365); Anisometropia; Myopia; Quadriplegia; Scoliosis; Seizure disorder; OSA (obstructive sleep apnea); and Sleep-related hypoventilation (07/08/2009).  Richard Oliver  has no past surgical history on file.  Prior to Admission medications   Medication Sig Start Date End Date Taking? Authorizing Provider  bisacodyl (DULCOLAX) 10 MG suppository Place 10 mg rectally as needed.     Yes Historical Provider, MD  brimonidine (ALPHAGAN) 0.15 % ophthalmic solution 1 drop right eye twice a day    Yes Historical Provider, MD  chlorhexidine (PERIDEX) 0.12 % solution as directed.     Yes Historical Provider, MD  Chlorophyll 3-0.6 MG TABS Take 1 tablet by mouth 2 (two) times daily.     Yes Historical Provider, MD  docusate sodium (COLACE) 50 MG capsule 1 every evening    Yes Historical Provider, MD  fluticasone (FLONASE) 50 MCG/ACT nasal spray Place 2 sprays into the nose daily.     Yes Historical Provider, MD  ketotifen (ZADITOR) 0.025 % ophthalmic solution as directed.     Yes Historical Provider, MD  latanoprost (XALATAN) 0.005 % ophthalmic solution as directed.     Yes Historical Provider, MD  Multiple Vitamin (MULTIVITAMIN) tablet Take 1 tablet by mouth daily.     Yes Historical Provider, MD  PHENobarbital (LUMINAL) 97.2 MG tablet Take 97.2 mg by mouth 2 (two) times daily.   Yes Historical Provider, MD  Polyethylene Glycol 400 (BLINK  TEARS OP) 1 drop both eyes 4 times a day as needed    Yes Historical Provider, MD  sodium chloride (OCEAN) 0.65 % nasal spray as directed.     Yes Historical Provider, MD  timolol (BETIMOL) 0.5 % ophthalmic solution 1 drop right eye every morning    Yes Historical Provider, MD  White Petrolatum-Mineral Oil (GENTEAL PM) 85-15 % OINT Apply to eye as directed.     Yes Historical Provider, MD    No Known Allergies   Physical Exam:  General - in wheelchair ENT - Narrow nasal angle, retrognathic, high arched palate, enlarged tongue Cardiac - s1s2 regular, no murmur Chest - No wheeze/rales/dullness Back - No focal tenderness Abd - Soft, non-tender Ext - No edema Neuro - Normal strength Skin - No rashes Psych - normal mood, and behavior   Assessment/Plan:  Richard Helling, MD [place] [facility]/Critical Care/Sleep Pager:  [phone]

## 2013-02-02 NOTE — Assessment & Plan Note (Signed)
He does not like the noise his machine has been making, and this wakes him up.  He also has trouble with his CPAP mask.  Will arrange for new CPAP machine and get download after he uses new machine for 1 month.  Will also arrange mask refit.

## 2013-02-02 NOTE — Assessment & Plan Note (Signed)
He is to continue with 4 liters oxygen with CPAP.

## 2013-08-10 ENCOUNTER — Ambulatory Visit (INDEPENDENT_AMBULATORY_CARE_PROVIDER_SITE_OTHER): Payer: Medicare Other | Admitting: Pulmonary Disease

## 2013-08-10 ENCOUNTER — Encounter: Payer: Self-pay | Admitting: Pulmonary Disease

## 2013-08-10 VITALS — BP 122/84 | HR 84 | Wt 154.0 lb

## 2013-08-10 DIAGNOSIS — G4736 Sleep related hypoventilation in conditions classified elsewhere: Secondary | ICD-10-CM

## 2013-08-10 DIAGNOSIS — G4739 Other sleep apnea: Secondary | ICD-10-CM

## 2013-08-10 DIAGNOSIS — G4733 Obstructive sleep apnea (adult) (pediatric): Secondary | ICD-10-CM

## 2013-08-10 NOTE — Assessment & Plan Note (Signed)
He is doing well with CPAP 17 cm H2O.  Will get his download.

## 2013-08-10 NOTE — Patient Instructions (Signed)
Follow up in 6 months 

## 2013-08-10 NOTE — Assessment & Plan Note (Signed)
He is to continue 4 liters oxygen at night with CPAP.

## 2013-08-10 NOTE — Progress Notes (Signed)
Chief Complaint  Patient presents with  . Follow-up    History of Present Illness: Richard Oliver is a 47 y.o. male severe OSA/OHS on CPAP 17 cm H2O with 4 liters oxygen.  He has been doing better since getting a new machine.  His new mask fits much better.  He is accompanied by attendant from his residential facility.  She reports that he is alert during the day.  TESTS: PSG 12/28/07 >> AHI 35   Richard Oliver  has a past medical history of Cerebral palsy; Severe mental retardation; Glaucoma; Anisometropia; Myopia; Quadriplegia; Scoliosis; Seizure disorder; OSA (obstructive sleep apnea); and Sleep-related hypoventilation (07/08/2009).  Richard Oliver  has no past surgical history on file.  Prior to Admission medications   Medication Sig Start Date End Date Taking? Authorizing Provider  bisacodyl (DULCOLAX) 10 MG suppository Place 10 mg rectally as needed.     Yes Historical Provider, MD  brimonidine (ALPHAGAN) 0.15 % ophthalmic solution 1 drop right eye twice a day    Yes Historical Provider, MD  chlorhexidine (PERIDEX) 0.12 % solution as directed.     Yes Historical Provider, MD  Chlorophyll 3-0.6 MG TABS Take 1 tablet by mouth 2 (two) times daily.     Yes Historical Provider, MD  docusate sodium (COLACE) 50 MG capsule 1 every evening    Yes Historical Provider, MD  fluticasone (FLONASE) 50 MCG/ACT nasal spray Place 2 sprays into the nose daily.     Yes Historical Provider, MD  ketotifen (ZADITOR) 0.025 % ophthalmic solution as directed.     Yes Historical Provider, MD  latanoprost (XALATAN) 0.005 % ophthalmic solution as directed.     Yes Historical Provider, MD  Multiple Vitamin (MULTIVITAMIN) tablet Take 1 tablet by mouth daily.     Yes Historical Provider, MD  PHENobarbital (LUMINAL) 97.2 MG tablet Take 97.2 mg by mouth 2 (two) times daily.   Yes Historical Provider, MD  Polyethylene Glycol 400 (BLINK TEARS OP) 1 drop both eyes 4 times a day as needed    Yes Historical Provider, MD   sodium chloride (OCEAN) 0.65 % nasal spray as directed.     Yes Historical Provider, MD  timolol (BETIMOL) 0.5 % ophthalmic solution 1 drop right eye every morning    Yes Historical Provider, MD  White Petrolatum-Mineral Oil (GENTEAL PM) 85-15 % OINT Apply to eye as directed.     Yes Historical Provider, MD    No Known Allergies   Physical Exam:  General - in wheelchair ENT - Narrow nasal angle, retrognathic, high arched palate, enlarged tongue Cardiac - s1s2 regular, no murmur Chest - No wheeze/rales/dullness Back - No focal tenderness Abd - Soft, non-tender Ext - No edema Neuro - Normal strength Skin - No rashes Psych - normal mood, and behavior   Assessment/Plan:  Coralyn Helling, MD [place] [facility]/Critical Care/Sleep Pager:  [phone]

## 2013-08-15 ENCOUNTER — Telehealth: Payer: Self-pay | Admitting: Pulmonary Disease

## 2013-08-15 DIAGNOSIS — G4733 Obstructive sleep apnea (adult) (pediatric): Secondary | ICD-10-CM

## 2013-08-15 NOTE — Telephone Encounter (Signed)
CPAP 05/17/13 to 08/14/13 >> Used on 63 of 90 nights with average 2 hrs 42 min.  Avearge AHI 0.7 with CPAP 17 cm H2O.  Will have my nurse inform pt caregiver that CPAP report shows good control of his sleep apnea.  Will have his DME decrease his CPAP from 17 to 15 cm H2O to determine if this is easier for [facility] to tolerate.  He needs to use CPAP for entire time he is asleep to get maximal benefit.

## 2013-08-16 NOTE — Telephone Encounter (Signed)
Richard Oliver is aware of Richard Oliver's CPAP report results. Order has already been placed for setting change on his machine.

## 2013-08-16 NOTE — Telephone Encounter (Signed)
Contacted the facility that Katherina Right is living in. LM for Aram Beecham to call back.

## 2014-02-06 ENCOUNTER — Encounter: Payer: Self-pay | Admitting: Pulmonary Disease

## 2014-02-06 ENCOUNTER — Ambulatory Visit (INDEPENDENT_AMBULATORY_CARE_PROVIDER_SITE_OTHER): Payer: Medicare Other | Admitting: Pulmonary Disease

## 2014-02-06 VITALS — BP 122/78 | HR 68 | Wt 153.0 lb

## 2014-02-06 DIAGNOSIS — G4736 Sleep related hypoventilation in conditions classified elsewhere: Secondary | ICD-10-CM

## 2014-02-06 DIAGNOSIS — IMO0002 Reserved for concepts with insufficient information to code with codable children: Secondary | ICD-10-CM

## 2014-02-06 DIAGNOSIS — G4733 Obstructive sleep apnea (adult) (pediatric): Secondary | ICD-10-CM

## 2014-02-06 DIAGNOSIS — G4739 Other sleep apnea: Secondary | ICD-10-CM

## 2014-02-06 NOTE — Assessment & Plan Note (Signed)
Continue CPAP 15 cm H2O.  Will arrange for new CPAP mask.

## 2014-02-06 NOTE — Patient Instructions (Signed)
Follow up in 1 year.

## 2014-02-06 NOTE — Assessment & Plan Note (Signed)
Continue 4 liters oxygen with CPAP.

## 2014-02-06 NOTE — Progress Notes (Signed)
Chief Complaint  Patient presents with  . Sleep Apnea    [name]  History of Present Illness: Richard Oliver is a 48 y.o. male severe OSA/OHS on CPAP 15 cm H2O with 4 liters oxygen.  He is here with his coordinator from group home.  She denies any issues with his sleep.  He is using CPAP through the night.  He wants a new mask.  He is using oxygen at night.  He goes to bed at 9 pm and wakes up at 6 am.   TESTS: PSG 12/28/07 >> AHI 35  CPAP 05/17/13 to 08/14/13 >> Used on 63 of 90 nights with average 2 hrs 42 min. Avearge AHI 0.7 with CPAP 17 cm H2O.  Richard Oliver  has a past medical history of Cerebral palsy; Severe mental retardation; Glaucoma; Anisometropia; Myopia; Quadriplegia; Scoliosis; Seizure disorder; OSA (obstructive sleep apnea); and Sleep-related hypoventilation (07/08/2009).  Richard Oliver  has no past surgical history on file.  Prior to Admission medications   Medication Sig Start Date End Date Taking? Authorizing Provider  bisacodyl (DULCOLAX) 10 MG suppository Place 10 mg rectally as needed.     Yes Historical Provider, MD  brimonidine (ALPHAGAN) 0.15 % ophthalmic solution 1 drop right eye twice a day    Yes Historical Provider, MD  chlorhexidine (PERIDEX) 0.12 % solution as directed.     Yes Historical Provider, MD  Chlorophyll 3-0.6 MG TABS Take 1 tablet by mouth 2 (two) times daily.     Yes Historical Provider, MD  docusate sodium (COLACE) 50 MG capsule 1 every evening    Yes Historical Provider, MD  fluticasone (FLONASE) 50 MCG/ACT nasal spray Place 2 sprays into the nose daily.     Yes Historical Provider, MD  ketotifen (ZADITOR) 0.025 % ophthalmic solution as directed.     Yes Historical Provider, MD  latanoprost (XALATAN) 0.005 % ophthalmic solution as directed.     Yes Historical Provider, MD  Multiple Vitamin (MULTIVITAMIN) tablet Take 1 tablet by mouth daily.     Yes Historical Provider, MD  PHENobarbital (LUMINAL) 97.2 MG tablet Take 97.2 mg by mouth 2  (two) times daily.   Yes Historical Provider, MD  Polyethylene Glycol 400 (BLINK TEARS OP) 1 drop both eyes 4 times a day as needed    Yes Historical Provider, MD  sodium chloride (OCEAN) 0.65 % nasal spray as directed.     Yes Historical Provider, MD  timolol (BETIMOL) 0.5 % ophthalmic solution 1 drop right eye every morning    Yes Historical Provider, MD  White Petrolatum-Mineral Oil (GENTEAL PM) 85-15 % OINT Apply to eye as directed.     Yes Historical Provider, MD    No Known Allergies   Physical Exam:  General - in wheelchair ENT - Narrow nasal angle, retrognathic, high arched palate, enlarged tongue Cardiac - s1s2 regular, no murmur Chest - No wheeze/rales/dullness Back - No focal tenderness Abd - Soft, non-tender Ext - No edema Neuro - Normal strength Skin - No rashes Psych - normal mood, and behavior   Assessment/Plan:  Chesley Mires, MD [place] [facility]/Critical Care/Sleep Pager:  [phone]

## 2014-05-01 ENCOUNTER — Telehealth: Payer: Self-pay | Admitting: Pulmonary Disease

## 2014-05-01 DIAGNOSIS — G4736 Sleep related hypoventilation in conditions classified elsewhere: Secondary | ICD-10-CM

## 2014-05-01 DIAGNOSIS — IMO0002 Reserved for concepts with insufficient information to code with codable children: Secondary | ICD-10-CM

## 2014-05-01 DIAGNOSIS — G4733 Obstructive sleep apnea (adult) (pediatric): Secondary | ICD-10-CM

## 2014-05-01 NOTE — Telephone Encounter (Signed)
Returning call please call back @ [phone].Hillery Hunter

## 2014-05-01 NOTE — Telephone Encounter (Signed)
lmomtcb x1 

## 2014-05-01 NOTE — Telephone Encounter (Signed)
Melissa w/ [facility] returned call.  Pt is requesting a portable concentrator to go with his CPAP at bedtime.  Pt wears 4lpm continuous and [facility] does not have a portable concentrator that goes that high.  Called Rhonda, pt's [facility] nurse and discussed the above with her.  She stated she will contact pt's case manager and coordinate w/ his group home to see if there are any alternatives -- Suanne Marker mentioned maybe using a dolly to help transport pt's concentrator.  Pt goes from his parents' home to the group home and transporting the heavy concentrator is cumbersome.  Will also forward to VS Suanne Marker is aware he is not back in the office until 7.9.15) to see if he perhaps has any recommendations.  Thank you.

## 2014-05-01 NOTE — Telephone Encounter (Signed)
Called # below and spoke w/ Suanne Marker. She reports pt received CPAP and O2 concentrator. Pt recently received a new concentrator and is not able to get this in pt home. Rhonda called [place] today to see if pt can get a diff machine. Pt uses 4 liters at bedtime with CPAP. I called Lenna Sciara w/ [facility] [phone] and she is going to check and see and what can be done. Will await call back

## 2014-05-02 NOTE — Telephone Encounter (Signed)
Only other option would be to determine if he can get a second concentrator for his home - not sure if this would be covered by insurance.

## 2014-05-03 NOTE — Telephone Encounter (Signed)
This referral request was sent to [facility], per [place] he knows for sure that insurance will not cover a 2nd concentrator. He will see what  [facility] is willing to do. The private pay [place] rental is $150. He says not sure if they will be able to come up with a solution. He will get back as soon as he hears from [facility].

## 2014-05-03 NOTE — Telephone Encounter (Signed)
Called spoke with [place]. She is aware of VS recs. She would like for me to send an order to [facility] and see. I have done so and sent a staff message to [facility]. Will await response.

## 2014-05-04 NOTE — Telephone Encounter (Signed)
Will hold message open until we hear back from [facility]

## 2014-05-04 NOTE — Telephone Encounter (Signed)
Noted.  Please update me when further information is available.

## 2014-05-07 NOTE — Telephone Encounter (Signed)
lmomtcb for Richard Oliver to check on the status of this.

## 2014-05-09 NOTE — Telephone Encounter (Signed)
Called and lmomtcb x 1 for [place] from [facility].  [phone]

## 2014-05-09 NOTE — Telephone Encounter (Signed)
Melissa returned call. Melissa stated that the issue is being reviewed by the Regional Director of [facility] to help with the issue. [facility] is requesting specific information on how long the pt is staying at both places and exactly when the pt needs the O2. I called the group home [phone] and spoke with Lonia Farber, LPN, she informed me that Mr. Matuszak has "therapuetic family visits" during the weekend and has to bring the concentrator to his parents house. Mr. Feighner then returns on Monday back at the group home. The pt's father has just recently had back sx and is unable to actually bring the concentrator in the house. Mr. Yamashiro family is having a family member try and help bring the concentrator in the house. I informed Suanne Marker that I will provide Melissa the information to pass along to help get the situation resolved. Lenna Sciara has been informed of all the neccessary information to be given to the [facility]. Melissa stated she would call back with an update after the information is passed along. Will await Melissa's call.

## 2014-05-09 NOTE — Telephone Encounter (Signed)
Melissa returned call  °

## 2014-05-09 NOTE — Telephone Encounter (Signed)
lmtcb for Melissa.  

## 2014-05-10 NOTE — Telephone Encounter (Signed)
Update on 2nd O2 concentrator from [place] w/ [facility], [phone].  Satira Anis

## 2014-05-10 NOTE — Telephone Encounter (Signed)
Spoke w/ Melissa. She reports they are going to loan pt a 2nd concentrator. At that point they will contact pt family to see how he is doing and they are going to work with pt insurance to try and get the 2nd concentrator.  Nothing further needed

## 2015-04-10 ENCOUNTER — Ambulatory Visit (INDEPENDENT_AMBULATORY_CARE_PROVIDER_SITE_OTHER): Payer: Medicare Other | Admitting: Pulmonary Disease

## 2015-04-10 ENCOUNTER — Encounter: Payer: Self-pay | Admitting: Pulmonary Disease

## 2015-04-10 VITALS — BP 154/98 | HR 78

## 2015-04-10 DIAGNOSIS — IMO0002 Reserved for concepts with insufficient information to code with codable children: Secondary | ICD-10-CM

## 2015-04-10 DIAGNOSIS — G4736 Sleep related hypoventilation in conditions classified elsewhere: Secondary | ICD-10-CM | POA: Diagnosis not present

## 2015-04-10 DIAGNOSIS — G4733 Obstructive sleep apnea (adult) (pediatric): Secondary | ICD-10-CM | POA: Diagnosis not present

## 2015-04-10 NOTE — Patient Instructions (Signed)
Follow up in 1 year.

## 2015-04-10 NOTE — Progress Notes (Signed)
Chief Complaint  Patient presents with  . Follow-up    Here with Richard Oliver - Reports patient is using CPAP machine nightly. Denies any issues with mask or pressure reported. Uses 4L O2 at night with CPAP.    CC: Dr. Melanee Left Oliver  History of Present Illness: Richard Oliver is a 49 y.o. male severe OSA/OHS on CPAP 15 cm H2O with 4 liters oxygen.  He is accompanied by attendant from group home.  Per report he is getting about 7 hours sleep per night.  Staff checks on him during the night to make sure he is using CPAP and oxygen.  He is not having problems with daytime alertness.  TESTS: PSG 12/28/07 >> AHI 35  CPAP 05/17/13 to 08/14/13 >> Used on 63 of 90 nights with average 2 hrs 42 min. Avearge AHI 0.7 with CPAP 17 cm H2O.  PMHx >> Cerebral palsy, severe mental retardation, Glaucoma, Quadriplegia, Scoliosis, Seizure disorder  PSHx, Medications, Allergies, Fhx, Shx reviewed.   Physical Exam: Blood pressure 154/98, pulse 78, SpO2 97 %.  General - in wheelchair ENT - Narrow nasal angle, retrognathic, high arched palate, enlarged tongue Cardiac - s1s2 regular, no murmur Chest - No wheeze/rales/dullness Back - No focal tenderness Abd - Soft, non-tender Ext - No edema Neuro - Normal strength Skin - No rashes Psych - normal mood, and behavior   Assessment/Plan:  Obstructive sleep apnea. Plan: - continue CPAP 15 cm H2O  Obesity hypoventilation syndrome. Plan: - continue 4 liters oxygen at night   Richard Mires, MD [place] [facility]/Critical Care/Sleep Pager:  [phone]

## 2015-04-17 ENCOUNTER — Telehealth: Payer: Self-pay | Admitting: Pulmonary Disease

## 2015-04-17 NOTE — Telephone Encounter (Signed)
Belenda Cruise calling from Dr. Koren Shiver' office, needs copy of LOV notes from visit with Dr. Halford Chessman. Faxed to Dr. Koren Shiver' office at [phone]  Nothing further needed.

## 2016-05-12 ENCOUNTER — Ambulatory Visit (INDEPENDENT_AMBULATORY_CARE_PROVIDER_SITE_OTHER): Payer: Medicare Other | Admitting: Pulmonary Disease

## 2016-05-12 ENCOUNTER — Encounter: Payer: Self-pay | Admitting: Pulmonary Disease

## 2016-05-12 VITALS — BP 116/70 | HR 80

## 2016-05-12 DIAGNOSIS — G4736 Sleep related hypoventilation in conditions classified elsewhere: Secondary | ICD-10-CM | POA: Diagnosis not present

## 2016-05-12 DIAGNOSIS — G4733 Obstructive sleep apnea (adult) (pediatric): Secondary | ICD-10-CM

## 2016-05-12 DIAGNOSIS — IMO0002 Reserved for concepts with insufficient information to code with codable children: Secondary | ICD-10-CM

## 2016-05-12 NOTE — Patient Instructions (Signed)
Will change to auto CPAP   Follow up in 1 year

## 2016-05-12 NOTE — Progress Notes (Signed)
Current Outpatient Prescriptions on File Prior to Visit  Medication Sig  . bisacodyl (DULCOLAX) 10 MG suppository Place 10 mg rectally as needed.    . brimonidine (ALPHAGAN) 0.15 % ophthalmic solution 1 drop right eye twice a day   . chlorhexidine (PERIDEX) 0.12 % solution as directed.    . CHLOROPHYLL PO Take 3 mg by mouth 2 (two) times daily.  Marland Kitchen docusate sodium (COLACE) 50 MG capsule 1 every evening   . fluticasone (FLONASE) 50 MCG/ACT nasal spray Place 2 sprays into the nose daily.    Marland Kitchen ketotifen (ZADITOR) 0.025 % ophthalmic solution as directed.    . latanoprost (XALATAN) 0.005 % ophthalmic solution as directed.    Marland Kitchen PHENobarbital (LUMINAL) 97.2 MG tablet Take 97.2 mg by mouth 2 (two) times daily.  . Polyethylene Glycol 3350 (MIRALAX PO) Take by mouth.  . timolol (BETIMOL) 0.5 % ophthalmic solution 1 drop right eye every morning   . triazolam (HALCION) 0.25 MG tablet Take 0.25 mg by mouth at bedtime as needed (PRIOR TO DENTAL PROCEDURES).  Dema Severin Petrolatum-Mineral Oil (GENTEAL PM) 85-15 % OINT Apply to eye as directed.     No current facility-administered medications on file prior to visit.    Chief Complaint  Patient presents with  . Follow-up    Wears CPAP most nights. Pt states that he needs more air in the CPAP and cannot sleep at night d/t not having enough. DME: [facility]    Sleep tests PSG 12/28/07 >> AHI 35  CPAP 05/17/13 to 08/14/13 >> Used on 63 of 90 nights with average 2 hrs 42 min. Avearge AHI 0.7 with CPAP 17 cm H2O.  Past medical history Cerebral palsy, severe mental retardation, Glaucoma, Quadriplegia, Scoliosis, Seizure disorder  Past medical history, Family history, Social history, Allergies reviewed.  Vital signs BP 116/70 mmHg  Pulse 80  Wt   SpO2 97%  History of Present Illness: Richard Oliver is a 50 y.o. male severe OSA/OHS on CPAP 15 cm H2O with 4 liters oxygen.  He feels like he is not getting enough pressure from his machine and having more trouble  sleeping at night because of this.  Physical Exam:  General - in wheelchair ENT - Narrow nasal angle, retrognathic, high arched palate, enlarged tongue Cardiac - s1s2 regular, no murmur Chest - No wheeze/rales/dullness Back - No focal tenderness Abd - Soft, non-tender Ext - No edema Neuro - Normal strength Skin - No rashes Psych - normal mood, and behavior   Assessment/Plan:  Obstructive sleep apnea. - will change him to auto CPAP range 10 to 20 cm H2O and get download  Obesity hypoventilation syndrome. - continue 4 liters oxygen at night   Patient Instructions  Will change to auto CPAP   Follow up in 1 year     Chesley Mires, MD [place] [facility]monary/Critical Care/Sleep Pager:  [phone] 05/12/2016, 12:31 PM

## 2016-08-11 ENCOUNTER — Encounter: Payer: Self-pay | Admitting: Internal Medicine

## 2016-10-09 ENCOUNTER — Ambulatory Visit: Payer: Medicare Other | Admitting: Internal Medicine

## 2016-11-03 DIAGNOSIS — Z8371 Family history of colonic polyps: Secondary | ICD-10-CM | POA: Diagnosis not present

## 2016-11-03 DIAGNOSIS — K635 Polyp of colon: Secondary | ICD-10-CM | POA: Diagnosis not present

## 2016-12-15 DIAGNOSIS — G40909 Epilepsy, unspecified, not intractable, without status epilepticus: Secondary | ICD-10-CM | POA: Diagnosis not present

## 2016-12-22 DIAGNOSIS — G40919 Epilepsy, unspecified, intractable, without status epilepticus: Secondary | ICD-10-CM | POA: Diagnosis not present

## 2016-12-22 DIAGNOSIS — F72 Severe intellectual disabilities: Secondary | ICD-10-CM | POA: Diagnosis not present

## 2016-12-22 DIAGNOSIS — G801 Spastic diplegic cerebral palsy: Secondary | ICD-10-CM | POA: Diagnosis not present

## 2016-12-23 DIAGNOSIS — Z79899 Other long term (current) drug therapy: Secondary | ICD-10-CM | POA: Diagnosis not present

## 2016-12-23 DIAGNOSIS — R569 Unspecified convulsions: Secondary | ICD-10-CM | POA: Diagnosis not present

## 2016-12-29 DIAGNOSIS — G40909 Epilepsy, unspecified, not intractable, without status epilepticus: Secondary | ICD-10-CM | POA: Diagnosis not present

## 2017-01-01 ENCOUNTER — Ambulatory Visit (HOSPITAL_COMMUNITY)
Admission: EM | Admit: 2017-01-01 | Discharge: 2017-01-01 | Disposition: A | Discharge status: Home / Self Care | Payer: Medicare Other | Attending: Emergency Medicine | Admitting: Emergency Medicine

## 2017-01-01 ENCOUNTER — Encounter (HOSPITAL_COMMUNITY): Payer: Self-pay | Admitting: *Deleted

## 2017-01-01 DIAGNOSIS — K2900 Acute gastritis without bleeding: Secondary | ICD-10-CM

## 2017-01-01 MED ORDER — ONDANSETRON 4 MG PO TBDP: 4.0000 mg | ORAL_TABLET | Freq: Three times a day (TID) | ORAL | 0 refills | Status: DC | PRN

## 2017-01-01 NOTE — ED Provider Notes (Signed)
CSN: [id number]     Arrival date & time 01/01/17  1632 History   None    Chief Complaint  Patient presents with  . Nausea   (Consider location/radiation/quality/duration/timing/severity/associated sxs/prior Treatment) 51 year old male presents to clinic with chief complaint of abdominal pain and nausea. History is provided by his caregiver. Patient is a resident of an assisted living/group home. He is unable to provide history, due to significant developmental delays. Caregiver says he has not had any fever, has had a few loose stool however states his stool was not diarrhea, caregiver further states patient has been eating and drinking normally today. Other review of systems were negative for complaints.   The history is provided by a caregiver.    Past Medical History:  Diagnosis Date  . Anisometropia   . Cerebral palsy ([place])   . Glaucoma   . Myopia   . OSA (obstructive sleep apnea)   . Quadriplegia ([place])   . Scoliosis   . Seizure disorder ([place])   . Severe mental retardation   . Sleep-related hypoventilation 07/08/2009   History reviewed. No pertinent surgical history. History reviewed. No pertinent family history. Social History  Substance Use Topics  . Smoking status: Never Smoker  . Smokeless tobacco: Not on file  . Alcohol use No    Review of Systems  Reason unable to perform ROS: As covered in history of present illness.  All other systems reviewed and are negative.   Allergies  Patient has no known allergies.  Home Medications   Prior to Admission medications   Medication Sig Start Date End Date Taking? Authorizing Provider  bisacodyl (DULCOLAX) 10 MG suppository Place 10 mg rectally as needed.      Historical Provider, MD  brimonidine (ALPHAGAN) 0.15 % ophthalmic solution 1 drop right eye twice a day     Historical Provider, MD  chlorhexidine (PERIDEX) 0.12 % solution as directed.      Historical Provider, MD  CHLOROPHYLL PO Take 3 mg by mouth 2 (two) times  daily.    Historical Provider, MD  Cholecalciferol (VITAMIN D3) 2000 units capsule Take 4,000 Units by mouth daily.    Historical Provider, MD  docusate sodium (COLACE) 50 MG capsule 1 every evening     Historical Provider, MD  fluticasone (FLONASE) 50 MCG/ACT nasal spray Place 2 sprays into the nose daily.      Historical Provider, MD  ketotifen (ZADITOR) 0.025 % ophthalmic solution as directed.      Historical Provider, MD  latanoprost (XALATAN) 0.005 % ophthalmic solution as directed.      Historical Provider, MD  loperamide (IMODIUM A-D) 2 MG tablet Take 2 mg by mouth 4 (four) times daily as needed for diarrhea or loose stools.    Historical Provider, MD  ondansetron (ZOFRAN ODT) 4 MG disintegrating tablet Take 1 tablet (4 mg total) by mouth every 8 (eight) hours as needed for nausea or vomiting. 01/01/17   Barnet Glasgow, NP  PHENobarbital (LUMINAL) 97.2 MG tablet Take 97.2 mg by mouth 2 (two) times daily.    Historical Provider, MD  Polyethylene Glycol 3350 (MIRALAX PO) Take by mouth.    Historical Provider, MD  timolol (BETIMOL) 0.5 % ophthalmic solution 1 drop right eye every morning     Historical Provider, MD  triazolam (HALCION) 0.25 MG tablet Take 0.25 mg by mouth at bedtime as needed (PRIOR TO DENTAL PROCEDURES).    Historical Provider, MD  White Petrolatum-Mineral Oil (GENTEAL PM) 85-15 % OINT Apply to eye as  directed.      Historical Provider, MD   Meds Ordered and Administered this Visit  Medications - No data to display  BP 132/78 (BP Location: Right Arm)   Pulse 78   Temp 98.6 F (37 C) (Oral)   SpO2 100%  No data found.   Physical Exam  Constitutional: He appears well-developed and well-nourished. No distress.  HENT:  Head: Normocephalic and atraumatic.  Right Ear: External ear normal.  Left Ear: External ear normal.  Mouth/Throat: Oropharynx is clear and moist.  Neck: Normal range of motion. Neck supple. No JVD present.  Cardiovascular: Normal rate and regular  rhythm.   Pulmonary/Chest: Effort normal and breath sounds normal.  Abdominal: Soft. Bowel sounds are normal. He exhibits no distension. There is no tenderness. There is no guarding.  Musculoskeletal: He exhibits no edema or tenderness.  Lymphadenopathy:    He has no cervical adenopathy.  Neurological: He is alert.  Skin: Skin is warm and dry. Capillary refill takes less than 2 seconds. No rash noted. He is not diaphoretic. No pallor.  Nursing note and vitals reviewed.   Urgent Care Course     Procedures (including critical care time)  Labs Review Labs Reviewed - No data to display  Imaging Review No results found.      MDM   1. Acute gastritis without hemorrhage, unspecified gastritis type     The patient most likely has gastritis. I prescribed Zofran 1 tablet under the tongue every 8 hours as needed for nausea, or vomiting. He may use over-the-counter loperamide one tablet after each loose stool as needed not to exceed 4 tablets in any 24 hour period should his symptoms worsen, or if they fail to improve, follow up with his primary care provider or return to clinic as needed. If he begins showing signs or symptoms of dehydration, such as dry mouth, lethargy, decreased urinary output, then consider going to the emergency room.      Barnet Glasgow, NP 01/01/17 [phone]

## 2017-01-01 NOTE — ED Triage Notes (Signed)
Pt developed   Nausea   And  Diarrhea       Today   According  To  Staff     Pt   Has  Not  Vomited     He  Is  In a  Wheelchair  Is  Is     Challenged      He hollars at intervals  When  questians  Are  Asked

## 2017-01-01 NOTE — Discharge Instructions (Signed)
The patient most likely has gastritis. I prescribed Zofran 1 tablet under the tongue every 8 hours as needed for nausea, or vomiting. He may use over-the-counter loperamide one tablet after each loose stool as needed not to exceed 4 tablets in any 24 hour period should his symptoms worsen, or if they fail to improve, follow up with his primary care provider or return to clinic as needed. If he begins showing signs or symptoms of dehydration, such as dry mouth, lethargy, decreased urinary output, then consider going to the emergency room.

## 2017-01-19 DIAGNOSIS — R269 Unspecified abnormalities of gait and mobility: Secondary | ICD-10-CM | POA: Diagnosis not present

## 2017-01-19 DIAGNOSIS — F72 Severe intellectual disabilities: Secondary | ICD-10-CM | POA: Diagnosis not present

## 2017-01-19 DIAGNOSIS — G801 Spastic diplegic cerebral palsy: Secondary | ICD-10-CM | POA: Diagnosis not present

## 2017-02-02 DIAGNOSIS — G40919 Epilepsy, unspecified, intractable, without status epilepticus: Secondary | ICD-10-CM | POA: Diagnosis not present

## 2017-02-02 DIAGNOSIS — F72 Severe intellectual disabilities: Secondary | ICD-10-CM | POA: Diagnosis not present

## 2017-02-02 DIAGNOSIS — G801 Spastic diplegic cerebral palsy: Secondary | ICD-10-CM | POA: Diagnosis not present

## 2017-03-02 DIAGNOSIS — F72 Severe intellectual disabilities: Secondary | ICD-10-CM | POA: Diagnosis not present

## 2017-03-02 DIAGNOSIS — G40919 Epilepsy, unspecified, intractable, without status epilepticus: Secondary | ICD-10-CM | POA: Diagnosis not present

## 2017-03-02 DIAGNOSIS — G801 Spastic diplegic cerebral palsy: Secondary | ICD-10-CM | POA: Diagnosis not present

## 2017-03-25 DIAGNOSIS — R52 Pain, unspecified: Secondary | ICD-10-CM | POA: Diagnosis not present

## 2017-03-30 DIAGNOSIS — J3489 Other specified disorders of nose and nasal sinuses: Secondary | ICD-10-CM | POA: Diagnosis not present

## 2017-03-30 DIAGNOSIS — G40909 Epilepsy, unspecified, not intractable, without status epilepticus: Secondary | ICD-10-CM | POA: Diagnosis not present

## 2017-04-06 DIAGNOSIS — G40919 Epilepsy, unspecified, intractable, without status epilepticus: Secondary | ICD-10-CM | POA: Diagnosis not present

## 2017-04-06 DIAGNOSIS — F72 Severe intellectual disabilities: Secondary | ICD-10-CM | POA: Diagnosis not present

## 2017-04-06 DIAGNOSIS — G801 Spastic diplegic cerebral palsy: Secondary | ICD-10-CM | POA: Diagnosis not present

## 2017-04-13 DIAGNOSIS — I10 Essential (primary) hypertension: Secondary | ICD-10-CM | POA: Diagnosis not present

## 2017-04-13 DIAGNOSIS — F72 Severe intellectual disabilities: Secondary | ICD-10-CM | POA: Diagnosis not present

## 2017-05-12 ENCOUNTER — Encounter: Payer: Self-pay | Admitting: Pulmonary Disease

## 2017-05-12 ENCOUNTER — Ambulatory Visit (INDEPENDENT_AMBULATORY_CARE_PROVIDER_SITE_OTHER): Payer: Medicare Other | Admitting: Pulmonary Disease

## 2017-05-12 VITALS — BP 102/62 | HR 72

## 2017-05-12 DIAGNOSIS — G4733 Obstructive sleep apnea (adult) (pediatric): Secondary | ICD-10-CM | POA: Diagnosis not present

## 2017-05-12 DIAGNOSIS — G4736 Sleep related hypoventilation in conditions classified elsewhere: Secondary | ICD-10-CM

## 2017-05-12 DIAGNOSIS — Z9989 Dependence on other enabling machines and devices: Secondary | ICD-10-CM

## 2017-05-12 DIAGNOSIS — IMO0002 Reserved for concepts with insufficient information to code with codable children: Secondary | ICD-10-CM

## 2017-05-12 NOTE — Patient Instructions (Signed)
Will get copy of CPAP report  Will arrange for new CPAP mask  Follow up in 1 year

## 2017-05-12 NOTE — Progress Notes (Signed)
Current Outpatient Prescriptions on File Prior to Visit  Medication Sig  . bisacodyl (DULCOLAX) 10 MG suppository Place 10 mg rectally as needed.    . brimonidine (ALPHAGAN) 0.15 % ophthalmic solution 1 drop right eye twice a day   . chlorhexidine (PERIDEX) 0.12 % solution as directed.    . CHLOROPHYLL PO Take 3 mg by mouth 2 (two) times daily.  . Cholecalciferol (VITAMIN D3) 2000 units capsule Take 4,000 Units by mouth daily.  Marland Kitchen docusate sodium (COLACE) 50 MG capsule 1 every evening   . fluticasone (FLONASE) 50 MCG/ACT nasal spray Place 2 sprays into the nose daily.    Marland Kitchen ketotifen (ZADITOR) 0.025 % ophthalmic solution as directed.    . latanoprost (XALATAN) 0.005 % ophthalmic solution as directed.    . loperamide (IMODIUM A-D) 2 MG tablet Take 2 mg by mouth 4 (four) times daily as needed for diarrhea or loose stools.  . ondansetron (ZOFRAN ODT) 4 MG disintegrating tablet Take 1 tablet (4 mg total) by mouth every 8 (eight) hours as needed for nausea or vomiting.  Marland Kitchen PHENobarbital (LUMINAL) 97.2 MG tablet Take 97.2 mg by mouth 2 (two) times daily.  . Polyethylene Glycol 3350 (MIRALAX PO) Take by mouth.  . timolol (BETIMOL) 0.5 % ophthalmic solution 1 drop right eye every morning   . triazolam (HALCION) 0.25 MG tablet Take 0.25 mg by mouth at bedtime as needed (PRIOR TO DENTAL PROCEDURES).  Dema Severin Petrolatum-Mineral Oil (GENTEAL PM) 85-15 % OINT Apply to eye as directed.     No current facility-administered medications on file prior to visit.     Chief Complaint  Patient presents with  . Follow-up    Uses CPAP nightly. Sleeps well at night. Denies any issues with mask/pressure. No download card in machine. DME: [facility]  (Group Home Manager presesnt during visit)    Sleep tests PSG 12/28/07 >> AHI 35  CPAP 05/17/13 to 08/14/13 >> Used on 63 of 90 nights with average 2 hrs 42 min. [facility]ge AHI 0.7 with CPAP 17 cm H2O.  Past medical history Cerebral palsy, severe mental retardation, Glaucoma,  Quadriplegia, Scoliosis, Seizure disorder  Past medical history, Family history, Social history, Allergies reviewed.  Vital signs BP 102/62 (BP Location: Right Arm, Cuff Size: Normal)   Pulse 72   SpO2 98%   History of Present Illness: Richard Oliver is a 51 y.o. male severe OSA/OHS on CPAP 15 cm H2O with 4 liters oxygen.  He is here with caregiver from group home.  No issues with his sleep noted.  He uses CPAP for most of the night.  He is using oxygen with CPAP.  He wants a new mask.  He is not having issues with daytime alertness.  Physical Exam:  General - in wheelchair Eyes - pupils reactive, wears glasses ENT - no sinus tenderness, no oral exudate, no LAN, high arched palate, enlarged tongue, retrognathic, garbled speech Cardiac - regular, no murmur Chest - no wheeze, rales Abd - soft, non tender Ext - no edema, brace on Lt leg Skin - no rashes Neuro - normal strength Psych - anxious   Assessment/Plan:  Obstructive sleep apnea. - reports compliance with CPAP and benefit form therapy - will get copy of his CPAP download - continue auto CPAP 10 to 20 cm H2O  Obesity hypoventilation syndrome. - continue 4 liters oxygen at night   Patient Instructions  Will get copy of CPAP report  Will arrange for new CPAP mask  Follow up in 1  year    Chesley Mires, MD [facility]/Sleep Pager:  [phone] 05/12/2017, 10:02 AM

## 2017-05-18 DIAGNOSIS — F72 Severe intellectual disabilities: Secondary | ICD-10-CM | POA: Diagnosis not present

## 2017-05-18 DIAGNOSIS — G4733 Obstructive sleep apnea (adult) (pediatric): Secondary | ICD-10-CM | POA: Diagnosis not present

## 2017-06-08 DIAGNOSIS — G801 Spastic diplegic cerebral palsy: Secondary | ICD-10-CM | POA: Diagnosis not present

## 2017-06-08 DIAGNOSIS — F72 Severe intellectual disabilities: Secondary | ICD-10-CM | POA: Diagnosis not present

## 2017-06-08 DIAGNOSIS — G40919 Epilepsy, unspecified, intractable, without status epilepticus: Secondary | ICD-10-CM | POA: Diagnosis not present

## 2017-06-17 DIAGNOSIS — R300 Dysuria: Secondary | ICD-10-CM | POA: Diagnosis not present

## 2017-06-24 DIAGNOSIS — R319 Hematuria, unspecified: Secondary | ICD-10-CM | POA: Diagnosis not present

## 2017-06-27 DIAGNOSIS — R319 Hematuria, unspecified: Secondary | ICD-10-CM | POA: Diagnosis not present

## 2017-06-29 DIAGNOSIS — K5900 Constipation, unspecified: Secondary | ICD-10-CM | POA: Diagnosis not present

## 2017-06-29 DIAGNOSIS — G809 Cerebral palsy, unspecified: Secondary | ICD-10-CM | POA: Diagnosis not present

## 2017-06-29 DIAGNOSIS — E785 Hyperlipidemia, unspecified: Secondary | ICD-10-CM | POA: Diagnosis not present

## 2017-06-29 DIAGNOSIS — R1031 Right lower quadrant pain: Secondary | ICD-10-CM | POA: Diagnosis not present

## 2017-06-29 DIAGNOSIS — Z79899 Other long term (current) drug therapy: Secondary | ICD-10-CM | POA: Diagnosis not present

## 2017-06-29 DIAGNOSIS — E559 Vitamin D deficiency, unspecified: Secondary | ICD-10-CM | POA: Diagnosis not present

## 2017-06-29 DIAGNOSIS — G4733 Obstructive sleep apnea (adult) (pediatric): Secondary | ICD-10-CM | POA: Diagnosis not present

## 2017-06-29 DIAGNOSIS — R109 Unspecified abdominal pain: Secondary | ICD-10-CM | POA: Diagnosis not present

## 2017-06-29 DIAGNOSIS — R319 Hematuria, unspecified: Secondary | ICD-10-CM | POA: Diagnosis not present

## 2017-06-29 DIAGNOSIS — G40919 Epilepsy, unspecified, intractable, without status epilepticus: Secondary | ICD-10-CM | POA: Diagnosis not present

## 2017-06-29 DIAGNOSIS — R1030 Lower abdominal pain, unspecified: Secondary | ICD-10-CM | POA: Diagnosis not present

## 2017-06-29 DIAGNOSIS — R300 Dysuria: Secondary | ICD-10-CM | POA: Diagnosis not present

## 2017-06-29 DIAGNOSIS — I10 Essential (primary) hypertension: Secondary | ICD-10-CM | POA: Diagnosis not present

## 2017-07-20 DIAGNOSIS — H401134 Primary open-angle glaucoma, bilateral, indeterminate stage: Secondary | ICD-10-CM | POA: Diagnosis not present

## 2017-07-20 DIAGNOSIS — H409 Unspecified glaucoma: Secondary | ICD-10-CM | POA: Diagnosis not present

## 2017-07-20 DIAGNOSIS — H52209 Unspecified astigmatism, unspecified eye: Secondary | ICD-10-CM | POA: Diagnosis not present

## 2017-07-20 DIAGNOSIS — G801 Spastic diplegic cerebral palsy: Secondary | ICD-10-CM | POA: Diagnosis not present

## 2017-07-20 DIAGNOSIS — F72 Severe intellectual disabilities: Secondary | ICD-10-CM | POA: Diagnosis not present

## 2017-07-27 DIAGNOSIS — I10 Essential (primary) hypertension: Secondary | ICD-10-CM | POA: Diagnosis not present

## 2017-07-27 DIAGNOSIS — E663 Overweight: Secondary | ICD-10-CM | POA: Diagnosis not present

## 2017-07-27 DIAGNOSIS — K5900 Constipation, unspecified: Secondary | ICD-10-CM | POA: Diagnosis not present

## 2017-07-27 DIAGNOSIS — J309 Allergic rhinitis, unspecified: Secondary | ICD-10-CM | POA: Diagnosis not present

## 2017-07-27 DIAGNOSIS — G40909 Epilepsy, unspecified, not intractable, without status epilepticus: Secondary | ICD-10-CM | POA: Diagnosis not present

## 2017-07-27 DIAGNOSIS — F72 Severe intellectual disabilities: Secondary | ICD-10-CM | POA: Diagnosis not present

## 2017-09-07 DIAGNOSIS — Z6829 Body mass index (BMI) 29.0-29.9, adult: Secondary | ICD-10-CM | POA: Diagnosis not present

## 2017-09-07 DIAGNOSIS — F72 Severe intellectual disabilities: Secondary | ICD-10-CM | POA: Diagnosis not present

## 2017-09-07 DIAGNOSIS — G40919 Epilepsy, unspecified, intractable, without status epilepticus: Secondary | ICD-10-CM | POA: Diagnosis not present

## 2017-09-07 DIAGNOSIS — G801 Spastic diplegic cerebral palsy: Secondary | ICD-10-CM | POA: Diagnosis not present

## 2017-09-28 DIAGNOSIS — E663 Overweight: Secondary | ICD-10-CM | POA: Diagnosis not present

## 2017-09-28 DIAGNOSIS — K5900 Constipation, unspecified: Secondary | ICD-10-CM | POA: Diagnosis not present

## 2017-09-28 DIAGNOSIS — G40909 Epilepsy, unspecified, not intractable, without status epilepticus: Secondary | ICD-10-CM | POA: Diagnosis not present

## 2017-09-28 DIAGNOSIS — I10 Essential (primary) hypertension: Secondary | ICD-10-CM | POA: Diagnosis not present

## 2017-09-28 DIAGNOSIS — F72 Severe intellectual disabilities: Secondary | ICD-10-CM | POA: Diagnosis not present

## 2017-11-02 DIAGNOSIS — G809 Cerebral palsy, unspecified: Secondary | ICD-10-CM | POA: Diagnosis not present

## 2017-11-02 DIAGNOSIS — D122 Benign neoplasm of ascending colon: Secondary | ICD-10-CM | POA: Diagnosis not present

## 2017-11-02 DIAGNOSIS — Z86010 Personal history of colonic polyps: Secondary | ICD-10-CM | POA: Diagnosis not present

## 2017-11-02 DIAGNOSIS — L814 Other melanin hyperpigmentation: Secondary | ICD-10-CM | POA: Diagnosis not present

## 2017-11-02 DIAGNOSIS — D120 Benign neoplasm of cecum: Secondary | ICD-10-CM | POA: Diagnosis not present

## 2017-11-02 DIAGNOSIS — K5730 Diverticulosis of large intestine without perforation or abscess without bleeding: Secondary | ICD-10-CM | POA: Diagnosis not present

## 2017-11-02 DIAGNOSIS — K648 Other hemorrhoids: Secondary | ICD-10-CM | POA: Diagnosis not present

## 2017-11-02 DIAGNOSIS — I10 Essential (primary) hypertension: Secondary | ICD-10-CM | POA: Diagnosis not present

## 2017-11-02 DIAGNOSIS — D123 Benign neoplasm of transverse colon: Secondary | ICD-10-CM | POA: Diagnosis not present

## 2017-11-02 DIAGNOSIS — Z09 Encounter for follow-up examination after completed treatment for conditions other than malignant neoplasm: Secondary | ICD-10-CM | POA: Diagnosis not present

## 2017-11-02 DIAGNOSIS — G4730 Sleep apnea, unspecified: Secondary | ICD-10-CM | POA: Diagnosis not present

## 2017-11-23 DIAGNOSIS — G801 Spastic diplegic cerebral palsy: Secondary | ICD-10-CM | POA: Diagnosis not present

## 2017-11-23 DIAGNOSIS — K635 Polyp of colon: Secondary | ICD-10-CM | POA: Diagnosis not present

## 2017-11-23 DIAGNOSIS — G40919 Epilepsy, unspecified, intractable, without status epilepticus: Secondary | ICD-10-CM | POA: Diagnosis not present

## 2017-11-23 DIAGNOSIS — Z6830 Body mass index (BMI) 30.0-30.9, adult: Secondary | ICD-10-CM | POA: Diagnosis not present

## 2017-11-30 DIAGNOSIS — G40919 Epilepsy, unspecified, intractable, without status epilepticus: Secondary | ICD-10-CM | POA: Diagnosis not present

## 2017-11-30 DIAGNOSIS — F72 Severe intellectual disabilities: Secondary | ICD-10-CM | POA: Diagnosis not present

## 2017-11-30 DIAGNOSIS — G801 Spastic diplegic cerebral palsy: Secondary | ICD-10-CM | POA: Diagnosis not present

## 2017-12-08 DIAGNOSIS — G40919 Epilepsy, unspecified, intractable, without status epilepticus: Secondary | ICD-10-CM | POA: Diagnosis not present

## 2017-12-08 DIAGNOSIS — F72 Severe intellectual disabilities: Secondary | ICD-10-CM | POA: Diagnosis not present

## 2017-12-08 DIAGNOSIS — G801 Spastic diplegic cerebral palsy: Secondary | ICD-10-CM | POA: Diagnosis not present

## 2017-12-28 DIAGNOSIS — G801 Spastic diplegic cerebral palsy: Secondary | ICD-10-CM | POA: Diagnosis not present

## 2017-12-28 DIAGNOSIS — G40919 Epilepsy, unspecified, intractable, without status epilepticus: Secondary | ICD-10-CM | POA: Diagnosis not present

## 2017-12-28 DIAGNOSIS — I10 Essential (primary) hypertension: Secondary | ICD-10-CM | POA: Diagnosis not present

## 2017-12-28 DIAGNOSIS — Z6830 Body mass index (BMI) 30.0-30.9, adult: Secondary | ICD-10-CM | POA: Diagnosis not present

## 2017-12-28 DIAGNOSIS — F72 Severe intellectual disabilities: Secondary | ICD-10-CM | POA: Diagnosis not present

## 2018-01-04 DIAGNOSIS — F72 Severe intellectual disabilities: Secondary | ICD-10-CM | POA: Diagnosis not present

## 2018-01-04 DIAGNOSIS — K5900 Constipation, unspecified: Secondary | ICD-10-CM | POA: Diagnosis not present

## 2018-01-04 DIAGNOSIS — I10 Essential (primary) hypertension: Secondary | ICD-10-CM | POA: Diagnosis not present

## 2018-01-04 DIAGNOSIS — G809 Cerebral palsy, unspecified: Secondary | ICD-10-CM | POA: Diagnosis not present

## 2018-01-04 DIAGNOSIS — G4733 Obstructive sleep apnea (adult) (pediatric): Secondary | ICD-10-CM | POA: Diagnosis not present

## 2018-01-11 DIAGNOSIS — G40919 Epilepsy, unspecified, intractable, without status epilepticus: Secondary | ICD-10-CM | POA: Diagnosis not present

## 2018-01-11 DIAGNOSIS — G801 Spastic diplegic cerebral palsy: Secondary | ICD-10-CM | POA: Diagnosis not present

## 2018-01-11 DIAGNOSIS — Z6830 Body mass index (BMI) 30.0-30.9, adult: Secondary | ICD-10-CM | POA: Diagnosis not present

## 2018-01-11 DIAGNOSIS — F72 Severe intellectual disabilities: Secondary | ICD-10-CM | POA: Diagnosis not present

## 2018-01-11 DIAGNOSIS — R269 Unspecified abnormalities of gait and mobility: Secondary | ICD-10-CM | POA: Diagnosis not present

## 2018-02-01 DIAGNOSIS — F72 Severe intellectual disabilities: Secondary | ICD-10-CM | POA: Diagnosis not present

## 2018-02-01 DIAGNOSIS — I10 Essential (primary) hypertension: Secondary | ICD-10-CM | POA: Diagnosis not present

## 2018-02-01 DIAGNOSIS — G40909 Epilepsy, unspecified, not intractable, without status epilepticus: Secondary | ICD-10-CM | POA: Diagnosis not present

## 2018-02-01 DIAGNOSIS — H401130 Primary open-angle glaucoma, bilateral, stage unspecified: Secondary | ICD-10-CM | POA: Diagnosis not present

## 2018-02-01 DIAGNOSIS — K5900 Constipation, unspecified: Secondary | ICD-10-CM | POA: Diagnosis not present

## 2018-02-17 DIAGNOSIS — E559 Vitamin D deficiency, unspecified: Secondary | ICD-10-CM | POA: Diagnosis not present

## 2018-02-17 DIAGNOSIS — Z79899 Other long term (current) drug therapy: Secondary | ICD-10-CM | POA: Diagnosis not present

## 2018-03-01 DIAGNOSIS — G4733 Obstructive sleep apnea (adult) (pediatric): Secondary | ICD-10-CM | POA: Diagnosis not present

## 2018-03-01 DIAGNOSIS — F72 Severe intellectual disabilities: Secondary | ICD-10-CM | POA: Diagnosis not present

## 2018-03-01 DIAGNOSIS — E559 Vitamin D deficiency, unspecified: Secondary | ICD-10-CM | POA: Diagnosis not present

## 2018-03-01 DIAGNOSIS — I10 Essential (primary) hypertension: Secondary | ICD-10-CM | POA: Diagnosis not present

## 2018-03-01 DIAGNOSIS — K5900 Constipation, unspecified: Secondary | ICD-10-CM | POA: Diagnosis not present

## 2018-03-01 DIAGNOSIS — G809 Cerebral palsy, unspecified: Secondary | ICD-10-CM | POA: Diagnosis not present

## 2018-03-08 DIAGNOSIS — G40919 Epilepsy, unspecified, intractable, without status epilepticus: Secondary | ICD-10-CM | POA: Diagnosis not present

## 2018-03-08 DIAGNOSIS — G801 Spastic diplegic cerebral palsy: Secondary | ICD-10-CM | POA: Diagnosis not present

## 2018-03-08 DIAGNOSIS — I10 Essential (primary) hypertension: Secondary | ICD-10-CM | POA: Diagnosis not present

## 2018-03-08 DIAGNOSIS — F72 Severe intellectual disabilities: Secondary | ICD-10-CM | POA: Diagnosis not present

## 2018-04-05 DIAGNOSIS — G40909 Epilepsy, unspecified, not intractable, without status epilepticus: Secondary | ICD-10-CM | POA: Diagnosis not present

## 2018-04-05 DIAGNOSIS — I10 Essential (primary) hypertension: Secondary | ICD-10-CM | POA: Diagnosis not present

## 2018-04-05 DIAGNOSIS — K5900 Constipation, unspecified: Secondary | ICD-10-CM | POA: Diagnosis not present

## 2018-04-05 DIAGNOSIS — G4733 Obstructive sleep apnea (adult) (pediatric): Secondary | ICD-10-CM | POA: Diagnosis not present

## 2018-04-05 DIAGNOSIS — F72 Severe intellectual disabilities: Secondary | ICD-10-CM | POA: Diagnosis not present

## 2018-05-03 DIAGNOSIS — H409 Unspecified glaucoma: Secondary | ICD-10-CM | POA: Diagnosis not present

## 2018-05-03 DIAGNOSIS — I10 Essential (primary) hypertension: Secondary | ICD-10-CM | POA: Diagnosis not present

## 2018-05-03 DIAGNOSIS — J309 Allergic rhinitis, unspecified: Secondary | ICD-10-CM | POA: Diagnosis not present

## 2018-05-03 DIAGNOSIS — E559 Vitamin D deficiency, unspecified: Secondary | ICD-10-CM | POA: Diagnosis not present

## 2018-05-03 DIAGNOSIS — F72 Severe intellectual disabilities: Secondary | ICD-10-CM | POA: Diagnosis not present

## 2018-05-03 DIAGNOSIS — G809 Cerebral palsy, unspecified: Secondary | ICD-10-CM | POA: Diagnosis not present

## 2018-05-10 ENCOUNTER — Ambulatory Visit: Payer: Medicare Other | Admitting: Pulmonary Disease

## 2018-05-10 DIAGNOSIS — G40919 Epilepsy, unspecified, intractable, without status epilepticus: Secondary | ICD-10-CM | POA: Diagnosis not present

## 2018-05-10 DIAGNOSIS — I10 Essential (primary) hypertension: Secondary | ICD-10-CM | POA: Diagnosis not present

## 2018-05-10 DIAGNOSIS — F72 Severe intellectual disabilities: Secondary | ICD-10-CM | POA: Diagnosis not present

## 2018-05-18 DIAGNOSIS — E039 Hypothyroidism, unspecified: Secondary | ICD-10-CM | POA: Diagnosis not present

## 2018-05-18 DIAGNOSIS — R7982 Elevated C-reactive protein (CRP): Secondary | ICD-10-CM | POA: Diagnosis not present

## 2018-05-18 DIAGNOSIS — E785 Hyperlipidemia, unspecified: Secondary | ICD-10-CM | POA: Diagnosis not present

## 2018-05-18 DIAGNOSIS — Z139 Encounter for screening, unspecified: Secondary | ICD-10-CM | POA: Diagnosis not present

## 2018-05-18 DIAGNOSIS — E559 Vitamin D deficiency, unspecified: Secondary | ICD-10-CM | POA: Diagnosis not present

## 2018-05-31 DIAGNOSIS — F72 Severe intellectual disabilities: Secondary | ICD-10-CM | POA: Diagnosis not present

## 2018-05-31 DIAGNOSIS — I10 Essential (primary) hypertension: Secondary | ICD-10-CM | POA: Diagnosis not present

## 2018-05-31 DIAGNOSIS — G40919 Epilepsy, unspecified, intractable, without status epilepticus: Secondary | ICD-10-CM | POA: Diagnosis not present

## 2018-06-06 ENCOUNTER — Ambulatory Visit (INDEPENDENT_AMBULATORY_CARE_PROVIDER_SITE_OTHER): Payer: Medicare Other | Admitting: Pulmonary Disease

## 2018-06-06 ENCOUNTER — Encounter: Payer: Self-pay | Admitting: Pulmonary Disease

## 2018-06-06 VITALS — BP 110/62 | HR 65

## 2018-06-06 DIAGNOSIS — Z9989 Dependence on other enabling machines and devices: Secondary | ICD-10-CM | POA: Diagnosis not present

## 2018-06-06 DIAGNOSIS — G4733 Obstructive sleep apnea (adult) (pediatric): Secondary | ICD-10-CM | POA: Diagnosis not present

## 2018-06-06 DIAGNOSIS — G4736 Sleep related hypoventilation in conditions classified elsewhere: Secondary | ICD-10-CM | POA: Diagnosis not present

## 2018-06-06 DIAGNOSIS — IMO0002 Reserved for concepts with insufficient information to code with codable children: Secondary | ICD-10-CM

## 2018-06-06 NOTE — Patient Instructions (Signed)
Will arrange for CPAP mask refitting and get copy of your CPAP report  Follow up in 1 yea

## 2018-06-06 NOTE — Progress Notes (Signed)
[facility], Critical Care, and Sleep Medicine  Chief Complaint  Patient presents with  . Follow-up    follow up one year on CPAP    Constitutional: BP 110/62 (BP Location: Left Arm, Cuff Size: Normal)   Pulse 65   SpO2 99%   History of Present Illness: Richard Oliver is a 52 y.o. male with severe OSA/OHS on CPAP 15 cm H2O with 4 liters oxygen.  He lives in a group home.  He is here with an attendant.  Reports to use CPAP nightly with oxygen.  Mask comes off and he has trouble moving in bed with CPAP.  No issues with daytime alertness.  Pt is not able to provide reliable history.  Comprehensive Respiratory Exam:  Appearance - alert ENMT -  No sinus tenderness, MP 4, retrognathia, enlarged tongue Neck - no masses, trachea midline Respiratory - no wheezing or rales CV - s1s2 regular rate and rhythm, no murmurs, no peripheral edema GI - soft, non tender Lymph - no adenopathy noted in neck MSK - sitting in wheelchair Ext - no cyanosis, clubbing, or joint inflammation noted Skin - no rashes, lesions, or ulcers Neuro - follows simple commands  Assessment/Plan:  Obstructive sleep apnea. - will get mask refitted - will get CPAP report - continue CPAP 17 cm H2O  Sleep related hypoxia/hypoventilation. - continue 4 liters oxygen with CPAP at night   Patient Instructions  Will arrange for CPAP mask refitting and get copy of your CPAP report  Follow up in [place], MD [place] 06/06/2018, 11:41 AM  Flow Sheet  Sleep tests: PSG 12/28/07 >> AHI 35  CPAP 05/17/13 to 08/14/13 >> Used on 63 of 90 nights with average 2 hrs 42 min. [facility]ge AHI 0.7 with CPAP 17 cm H2O.  Past Medical History: He  has a past medical history of Anisometropia, Cerebral palsy ([place]), Glaucoma, Myopia, OSA (obstructive sleep apnea), Quadriplegia ([place]), Scoliosis, Seizure disorder ([place]), Severe mental retardation, and Sleep-related hypoventilation  (07/08/2009).  Social History: He  reports that he has never smoked. He has never used smokeless tobacco. He reports that he does not drink alcohol.  Medications: Allergies as of 06/06/2018   No Known Allergies     Medication List        Accurate as of 06/06/18 11:41 AM. Always use your most recent med list.          bisacodyl 10 MG suppository Commonly known as:  DULCOLAX Place 10 mg rectally as needed.   brimonidine 0.15 % ophthalmic solution Commonly known as:  ALPHAGAN 1 drop right eye twice a day   chlorhexidine 0.12 % solution Commonly known as:  PERIDEX as directed.   CHLOROPHYLL PO Take 3 mg by mouth 2 (two) times daily.   docusate sodium 50 MG capsule Commonly known as:  COLACE 1 every evening   fluticasone 50 MCG/ACT nasal spray Commonly known as:  FLONASE Place 2 sprays into the nose daily.   GENTEAL PM 85-15 % Oint Apply to eye as directed.   ketotifen 0.025 % ophthalmic solution Commonly known as:  ZADITOR as directed.   latanoprost 0.005 % ophthalmic solution Commonly known as:  XALATAN as directed.   loperamide 2 MG tablet Commonly known as:  IMODIUM A-D Take 2 mg by mouth 4 (four) times daily as needed for diarrhea or loose stools.   MIRALAX PO Take by mouth.   ondansetron 4 MG disintegrating tablet Commonly known as:  ZOFRAN-ODT Take  1 tablet (4 mg total) by mouth every 8 (eight) hours as needed for nausea or vomiting.   PHENobarbital 97.2 MG tablet Commonly known as:  LUMINAL Take 97.2 mg by mouth 2 (two) times daily.   timolol 0.5 % ophthalmic solution Commonly known as:  BETIMOL 1 drop right eye every morning   triazolam 0.25 MG tablet Commonly known as:  HALCION Take 0.25 mg by mouth at bedtime as needed (PRIOR TO DENTAL PROCEDURES).   Vitamin D3 2000 units capsule Take 4,000 Units by mouth daily.

## 2018-06-07 DIAGNOSIS — Q845 Enlarged and hypertrophic nails: Secondary | ICD-10-CM | POA: Diagnosis not present

## 2018-06-07 DIAGNOSIS — F72 Severe intellectual disabilities: Secondary | ICD-10-CM | POA: Diagnosis not present

## 2018-06-07 DIAGNOSIS — K5900 Constipation, unspecified: Secondary | ICD-10-CM | POA: Diagnosis not present

## 2018-06-07 DIAGNOSIS — I10 Essential (primary) hypertension: Secondary | ICD-10-CM | POA: Diagnosis not present

## 2018-06-07 DIAGNOSIS — G4733 Obstructive sleep apnea (adult) (pediatric): Secondary | ICD-10-CM | POA: Diagnosis not present

## 2018-06-07 DIAGNOSIS — G40909 Epilepsy, unspecified, not intractable, without status epilepticus: Secondary | ICD-10-CM | POA: Diagnosis not present

## 2018-06-08 DIAGNOSIS — I10 Essential (primary) hypertension: Secondary | ICD-10-CM | POA: Diagnosis not present

## 2018-06-08 DIAGNOSIS — F72 Severe intellectual disabilities: Secondary | ICD-10-CM | POA: Diagnosis not present

## 2018-06-08 DIAGNOSIS — K219 Gastro-esophageal reflux disease without esophagitis: Secondary | ICD-10-CM | POA: Diagnosis not present

## 2018-06-08 DIAGNOSIS — H409 Unspecified glaucoma: Secondary | ICD-10-CM | POA: Diagnosis not present

## 2018-06-16 ENCOUNTER — Ambulatory Visit: Payer: Medicare Other | Admitting: Podiatry

## 2018-06-22 ENCOUNTER — Ambulatory Visit: Payer: Medicare Other | Admitting: Podiatry

## 2018-06-28 DIAGNOSIS — F72 Severe intellectual disabilities: Secondary | ICD-10-CM | POA: Diagnosis not present

## 2018-06-28 DIAGNOSIS — I10 Essential (primary) hypertension: Secondary | ICD-10-CM | POA: Diagnosis not present

## 2018-06-28 DIAGNOSIS — G40919 Epilepsy, unspecified, intractable, without status epilepticus: Secondary | ICD-10-CM | POA: Diagnosis not present

## 2018-07-05 DIAGNOSIS — G809 Cerebral palsy, unspecified: Secondary | ICD-10-CM | POA: Diagnosis not present

## 2018-07-05 DIAGNOSIS — Z0000 Encounter for general adult medical examination without abnormal findings: Secondary | ICD-10-CM | POA: Diagnosis not present

## 2018-07-05 DIAGNOSIS — K219 Gastro-esophageal reflux disease without esophagitis: Secondary | ICD-10-CM | POA: Diagnosis not present

## 2018-07-05 DIAGNOSIS — G4733 Obstructive sleep apnea (adult) (pediatric): Secondary | ICD-10-CM | POA: Diagnosis not present

## 2018-07-05 DIAGNOSIS — R269 Unspecified abnormalities of gait and mobility: Secondary | ICD-10-CM | POA: Diagnosis not present

## 2018-07-05 DIAGNOSIS — G40909 Epilepsy, unspecified, not intractable, without status epilepticus: Secondary | ICD-10-CM | POA: Diagnosis not present

## 2018-07-05 DIAGNOSIS — F72 Severe intellectual disabilities: Secondary | ICD-10-CM | POA: Diagnosis not present

## 2018-07-05 DIAGNOSIS — I10 Essential (primary) hypertension: Secondary | ICD-10-CM | POA: Diagnosis not present

## 2018-07-05 DIAGNOSIS — K5900 Constipation, unspecified: Secondary | ICD-10-CM | POA: Diagnosis not present

## 2018-07-07 ENCOUNTER — Ambulatory Visit (INDEPENDENT_AMBULATORY_CARE_PROVIDER_SITE_OTHER): Payer: Medicare Other | Admitting: Podiatry

## 2018-07-07 DIAGNOSIS — M79609 Pain in unspecified limb: Secondary | ICD-10-CM | POA: Diagnosis not present

## 2018-07-07 DIAGNOSIS — B351 Tinea unguium: Secondary | ICD-10-CM | POA: Diagnosis not present

## 2018-07-07 MED ORDER — KETOCONAZOLE 2 % EX CREA: TOPICAL_CREAM | CUTANEOUS | 0 refills | Status: DC

## 2018-07-07 NOTE — Progress Notes (Signed)
Subjective:  [name], male    DOB: 12/10/1965,  MRN: [id number]  Chief Complaint  Patient presents with  . Nail Problem    bilateral elongated painful toenails    52 y.o. male presents with the above complaint.  Reports painfully elongated nails to both feet. History of what appears to be cerebral palsy. Patient communicative but does not meaningfully contribute to history.  Review of Systems: Negative except as noted in the HPI. Denies N/V/F/Ch.  Past Medical History:  Diagnosis Date  . Anisometropia   . Cerebral palsy ([place])   . Glaucoma   . Myopia   . OSA (obstructive sleep apnea)   . Quadriplegia ([place])   . Scoliosis   . Seizure disorder ([place])   . Severe mental retardation   . Sleep-related hypoventilation 07/08/2009    Current Outpatient Medications:  .  bisacodyl (DULCOLAX) 10 MG suppository, Place 10 mg rectally as needed.  , Disp: , Rfl:  .  brimonidine (ALPHAGAN) 0.15 % ophthalmic solution, 1 drop right eye twice a day , Disp: , Rfl:  .  chlorhexidine (PERIDEX) 0.12 % solution, as directed.  , Disp: , Rfl:  .  CHLOROPHYLL PO, Take 3 mg by mouth 2 (two) times daily., Disp: , Rfl:  .  Cholecalciferol (VITAMIN D3) 2000 units capsule, Take 4,000 Units by mouth daily., Disp: , Rfl:  .  docusate sodium (COLACE) 50 MG capsule, 1 every evening , Disp: , Rfl:  .  fluticasone (FLONASE) 50 MCG/ACT nasal spray, Place 2 sprays into the nose daily.  , Disp: , Rfl:  .  ketoconazole (NIZORAL) 2 % cream, Apply 1 fingertip amount to each foot daily., Disp: 30 g, Rfl: 0 .  ketotifen (ZADITOR) 0.025 % ophthalmic solution, as directed.  , Disp: , Rfl:  .  latanoprost (XALATAN) 0.005 % ophthalmic solution, as directed.  , Disp: , Rfl:  .  loperamide (IMODIUM A-D) 2 MG tablet, Take 2 mg by mouth 4 (four) times daily as needed for diarrhea or loose stools., Disp: , Rfl:  .  ondansetron (ZOFRAN ODT) 4 MG disintegrating tablet, Take 1 tablet (4 mg total) by mouth every 8 (eight)  hours as needed for nausea or vomiting., Disp: 20 tablet, Rfl: 0 .  PHENobarbital (LUMINAL) 97.2 MG tablet, Take 97.2 mg by mouth 2 (two) times daily., Disp: , Rfl:  .  Polyethylene Glycol 3350 (MIRALAX PO), Take by mouth., Disp: , Rfl:  .  timolol (BETIMOL) 0.5 % ophthalmic solution, 1 drop right eye every morning , Disp: , Rfl:  .  triazolam (HALCION) 0.25 MG tablet, Take 0.25 mg by mouth at bedtime as needed (PRIOR TO DENTAL PROCEDURES)., Disp: , Rfl:  .  White Petrolatum-Mineral Oil (GENTEAL PM) 85-15 % OINT, Apply to eye as directed.  , Disp: , Rfl:   Social History   Tobacco Use  Smoking Status Never Smoker  Smokeless Tobacco Never Used    No Known Allergies Objective:  There were no vitals filed for this visit. There is no height or weight on file to calculate BMI. Constitutional Well developed. Well nourished.  Vascular Dorsalis pedis pulses palpable bilaterally. Posterior tibial pulses palpable bilaterally. Capillary refill normal to all digits.  No cyanosis or clubbing noted. Pedal hair growth normal.  Neurologic Responsds to touch, painful stimuli.  Dermatologic Nails elongated dystrophic pain to palpation No open wounds. No skin lesions.  Orthopedic: Normal joint ROM without pain or crepitus bilaterally. No visible deformities. No bony tenderness.  Radiographs: None Assessment:   1. Pain due to onychomycosis of nail    Plan:  Patient was evaluated and treated and all questions answered.  Onychomycosis with pain -Nails palliatively debridement as below -Rx Ketoconazole to be applied daily by facility.  Procedure: Nail Debridement Rationale: Pain Type of Debridement: manual, sharp debridement. Instrumentation: Nail nipper, rotary burr. Number of Nails: 10  Return if symptoms worsen or fail to improve.

## 2018-07-12 DIAGNOSIS — Z6829 Body mass index (BMI) 29.0-29.9, adult: Secondary | ICD-10-CM | POA: Diagnosis not present

## 2018-07-12 DIAGNOSIS — B351 Tinea unguium: Secondary | ICD-10-CM | POA: Diagnosis not present

## 2018-07-12 DIAGNOSIS — I10 Essential (primary) hypertension: Secondary | ICD-10-CM | POA: Diagnosis not present

## 2018-07-12 DIAGNOSIS — F72 Severe intellectual disabilities: Secondary | ICD-10-CM | POA: Diagnosis not present

## 2018-07-26 DIAGNOSIS — F72 Severe intellectual disabilities: Secondary | ICD-10-CM | POA: Diagnosis not present

## 2018-07-26 DIAGNOSIS — R05 Cough: Secondary | ICD-10-CM | POA: Diagnosis not present

## 2018-07-26 DIAGNOSIS — J069 Acute upper respiratory infection, unspecified: Secondary | ICD-10-CM | POA: Diagnosis not present

## 2018-08-04 DIAGNOSIS — H401134 Primary open-angle glaucoma, bilateral, indeterminate stage: Secondary | ICD-10-CM | POA: Diagnosis not present

## 2018-09-01 DIAGNOSIS — Z23 Encounter for immunization: Secondary | ICD-10-CM | POA: Diagnosis not present

## 2018-09-01 DIAGNOSIS — F72 Severe intellectual disabilities: Secondary | ICD-10-CM | POA: Diagnosis not present

## 2018-09-01 DIAGNOSIS — K219 Gastro-esophageal reflux disease without esophagitis: Secondary | ICD-10-CM | POA: Diagnosis not present

## 2018-09-01 DIAGNOSIS — I10 Essential (primary) hypertension: Secondary | ICD-10-CM | POA: Diagnosis not present

## 2018-09-01 DIAGNOSIS — K5901 Slow transit constipation: Secondary | ICD-10-CM | POA: Diagnosis not present

## 2018-09-01 DIAGNOSIS — Z6829 Body mass index (BMI) 29.0-29.9, adult: Secondary | ICD-10-CM | POA: Diagnosis not present

## 2018-09-06 DIAGNOSIS — I10 Essential (primary) hypertension: Secondary | ICD-10-CM | POA: Diagnosis not present

## 2018-09-06 DIAGNOSIS — G40909 Epilepsy, unspecified, not intractable, without status epilepticus: Secondary | ICD-10-CM | POA: Diagnosis not present

## 2018-09-06 DIAGNOSIS — K5900 Constipation, unspecified: Secondary | ICD-10-CM | POA: Diagnosis not present

## 2018-09-06 DIAGNOSIS — Z6829 Body mass index (BMI) 29.0-29.9, adult: Secondary | ICD-10-CM | POA: Diagnosis not present

## 2018-09-06 DIAGNOSIS — F72 Severe intellectual disabilities: Secondary | ICD-10-CM | POA: Diagnosis not present

## 2018-11-10 DIAGNOSIS — Z79899 Other long term (current) drug therapy: Secondary | ICD-10-CM | POA: Diagnosis not present

## 2018-11-29 DIAGNOSIS — K5901 Slow transit constipation: Secondary | ICD-10-CM | POA: Diagnosis not present

## 2018-11-29 DIAGNOSIS — F72 Severe intellectual disabilities: Secondary | ICD-10-CM | POA: Diagnosis not present

## 2018-11-29 DIAGNOSIS — K219 Gastro-esophageal reflux disease without esophagitis: Secondary | ICD-10-CM | POA: Diagnosis not present

## 2018-11-29 DIAGNOSIS — Z6829 Body mass index (BMI) 29.0-29.9, adult: Secondary | ICD-10-CM | POA: Diagnosis not present

## 2018-11-29 DIAGNOSIS — I10 Essential (primary) hypertension: Secondary | ICD-10-CM | POA: Diagnosis not present

## 2018-12-27 DIAGNOSIS — I10 Essential (primary) hypertension: Secondary | ICD-10-CM | POA: Diagnosis not present

## 2018-12-27 DIAGNOSIS — F72 Severe intellectual disabilities: Secondary | ICD-10-CM | POA: Diagnosis not present

## 2018-12-27 DIAGNOSIS — Z6821 Body mass index (BMI) 21.0-21.9, adult: Secondary | ICD-10-CM | POA: Diagnosis not present

## 2018-12-27 DIAGNOSIS — G40919 Epilepsy, unspecified, intractable, without status epilepticus: Secondary | ICD-10-CM | POA: Diagnosis not present

## 2019-01-03 DIAGNOSIS — Z6829 Body mass index (BMI) 29.0-29.9, adult: Secondary | ICD-10-CM | POA: Diagnosis not present

## 2019-01-03 DIAGNOSIS — I10 Essential (primary) hypertension: Secondary | ICD-10-CM | POA: Diagnosis not present

## 2019-01-03 DIAGNOSIS — G4733 Obstructive sleep apnea (adult) (pediatric): Secondary | ICD-10-CM | POA: Diagnosis not present

## 2019-01-03 DIAGNOSIS — F79 Unspecified intellectual disabilities: Secondary | ICD-10-CM | POA: Diagnosis not present

## 2019-01-03 DIAGNOSIS — K5904 Chronic idiopathic constipation: Secondary | ICD-10-CM | POA: Diagnosis not present

## 2019-02-06 DIAGNOSIS — G4733 Obstructive sleep apnea (adult) (pediatric): Secondary | ICD-10-CM | POA: Diagnosis not present

## 2019-02-06 DIAGNOSIS — G40919 Epilepsy, unspecified, intractable, without status epilepticus: Secondary | ICD-10-CM | POA: Diagnosis not present

## 2019-02-06 DIAGNOSIS — Z6829 Body mass index (BMI) 29.0-29.9, adult: Secondary | ICD-10-CM | POA: Diagnosis not present

## 2019-02-06 DIAGNOSIS — I10 Essential (primary) hypertension: Secondary | ICD-10-CM | POA: Diagnosis not present

## 2019-02-06 DIAGNOSIS — F72 Severe intellectual disabilities: Secondary | ICD-10-CM | POA: Diagnosis not present

## 2019-02-15 DIAGNOSIS — G4733 Obstructive sleep apnea (adult) (pediatric): Secondary | ICD-10-CM | POA: Diagnosis not present

## 2019-02-15 DIAGNOSIS — F72 Severe intellectual disabilities: Secondary | ICD-10-CM | POA: Diagnosis not present

## 2019-02-15 DIAGNOSIS — Z6829 Body mass index (BMI) 29.0-29.9, adult: Secondary | ICD-10-CM | POA: Diagnosis not present

## 2019-02-15 DIAGNOSIS — I10 Essential (primary) hypertension: Secondary | ICD-10-CM | POA: Diagnosis not present

## 2019-02-15 DIAGNOSIS — G40919 Epilepsy, unspecified, intractable, without status epilepticus: Secondary | ICD-10-CM | POA: Diagnosis not present

## 2019-05-25 DIAGNOSIS — E785 Hyperlipidemia, unspecified: Secondary | ICD-10-CM | POA: Diagnosis not present

## 2019-05-25 DIAGNOSIS — I10 Essential (primary) hypertension: Secondary | ICD-10-CM | POA: Diagnosis not present

## 2019-05-25 DIAGNOSIS — E039 Hypothyroidism, unspecified: Secondary | ICD-10-CM | POA: Diagnosis not present

## 2019-05-25 DIAGNOSIS — Z79899 Other long term (current) drug therapy: Secondary | ICD-10-CM | POA: Diagnosis not present

## 2019-05-25 DIAGNOSIS — D649 Anemia, unspecified: Secondary | ICD-10-CM | POA: Diagnosis not present

## 2019-05-25 DIAGNOSIS — E559 Vitamin D deficiency, unspecified: Secondary | ICD-10-CM | POA: Diagnosis not present

## 2019-06-08 DIAGNOSIS — G40919 Epilepsy, unspecified, intractable, without status epilepticus: Secondary | ICD-10-CM | POA: Diagnosis not present

## 2019-06-08 DIAGNOSIS — I10 Essential (primary) hypertension: Secondary | ICD-10-CM | POA: Diagnosis not present

## 2019-06-08 DIAGNOSIS — F72 Severe intellectual disabilities: Secondary | ICD-10-CM | POA: Diagnosis not present

## 2019-06-13 DIAGNOSIS — I10 Essential (primary) hypertension: Secondary | ICD-10-CM | POA: Diagnosis not present

## 2019-06-13 DIAGNOSIS — K5901 Slow transit constipation: Secondary | ICD-10-CM | POA: Diagnosis not present

## 2019-06-13 DIAGNOSIS — K219 Gastro-esophageal reflux disease without esophagitis: Secondary | ICD-10-CM | POA: Diagnosis not present

## 2019-06-13 DIAGNOSIS — F72 Severe intellectual disabilities: Secondary | ICD-10-CM | POA: Diagnosis not present

## 2019-06-27 DIAGNOSIS — G40919 Epilepsy, unspecified, intractable, without status epilepticus: Secondary | ICD-10-CM | POA: Diagnosis not present

## 2019-06-27 DIAGNOSIS — I10 Essential (primary) hypertension: Secondary | ICD-10-CM | POA: Diagnosis not present

## 2019-06-27 DIAGNOSIS — F72 Severe intellectual disabilities: Secondary | ICD-10-CM | POA: Diagnosis not present

## 2019-07-06 ENCOUNTER — Ambulatory Visit (INDEPENDENT_AMBULATORY_CARE_PROVIDER_SITE_OTHER): Payer: Medicare Other | Admitting: Pulmonary Disease

## 2019-07-06 ENCOUNTER — Encounter: Payer: Self-pay | Admitting: Pulmonary Disease

## 2019-07-06 ENCOUNTER — Other Ambulatory Visit: Payer: Self-pay

## 2019-07-06 DIAGNOSIS — G4736 Sleep related hypoventilation in conditions classified elsewhere: Secondary | ICD-10-CM

## 2019-07-06 DIAGNOSIS — IMO0002 Reserved for concepts with insufficient information to code with codable children: Secondary | ICD-10-CM

## 2019-07-06 DIAGNOSIS — Z9989 Dependence on other enabling machines and devices: Secondary | ICD-10-CM

## 2019-07-06 DIAGNOSIS — G4733 Obstructive sleep apnea (adult) (pediatric): Secondary | ICD-10-CM

## 2019-07-06 NOTE — Progress Notes (Signed)
Pulmonary, Critical Care, and Sleep Medicine  Chief Complaint  Patient presents with  . Follow-up    osa, feels that rubber on mask is to tight, DME [employer]    Constitutional:  There were no vitals taken for this visit.  Deferred.  Past Medical History:  Severe mental retardation, Seizures, Scoliosis, Glaucoma, Cerebral palsy  Brief Summary:  Richard Oliver is a 53 y.o. male with obstructive sleep apnea and obesity hypoventilation syndrome.  Virtual Visit via Telephone Note  I connected with Ranae Palms on 07/06/19 at 11:00 AM EDT by telephone and verified that I am speaking with the correct person using two identifiers.  Location: Patient: care facility Provider: medical office   I discussed the limitations, risks, security and privacy concerns of performing an evaluation and management service by telephone and the availability of in person appointments. I also discussed with the patient that there may be a patient responsible charge related to this service. The patient expressed understanding and agreed to proceed.  Spoke with pt's caregiver, Ninfa Linden.  He is monitor throughout the night at facility to ensure compliance with CPAP and oxygen.  No obvious issues with mask fit.  Naps during the day sometimes, and staff has him wear CPAP and oxygen then.  Physical Exam:  Deferred.  Assessment/Plan:   Obstructive sleep apnea. - reported compliance and benefit from therapy - continue CPAP 17 cm H2O  Sleep related hypoxia/hypoventilation. - continue 4 liters oxygen with CPAP at night  Patient Instructions  Follow up in 1 year   I discussed the assessment and treatment plan with the patient. The patient was provided an opportunity to ask questions and all were answered. The patient agreed with the plan and demonstrated an understanding of the instructions.   The patient was advised to call back or seek an in-person evaluation if the symptoms worsen or if the  condition fails to improve as anticipated.  I provided 8 minutes of non-face-to-face time during this encounter.   Chesley Mires, MD  Chesley Mires, MD [place] Pager: [phone] 07/06/2019, 12:04 PM  Flow Sheet    Sleep tests:  PSG 12/28/07 >>AHI 35  CPAP 05/17/13 to 08/14/13 >> Used on 63 of 90 nights with average 2 hrs 42 min. [facility] AHI 0.7 with CPAP 17 cm H2O.  Medications:   Allergies as of 07/06/2019   No Known Allergies     Medication List       Accurate as of July 06, 2019 12:04 PM. If you have any questions, ask your nurse or doctor.        bisacodyl 10 MG suppository Commonly known as: DULCOLAX Place 10 mg rectally as needed.   brimonidine 0.15 % ophthalmic solution Commonly known as: ALPHAGAN 1 drop right eye twice a day   chlorhexidine 0.12 % solution Commonly known as: PERIDEX as directed.   CHLOROPHYLL PO Take 3 mg by mouth 2 (two) times daily.   docusate sodium 50 MG capsule Commonly known as: COLACE 1 every evening   fluticasone 50 MCG/ACT nasal spray Commonly known as: FLONASE Place 2 sprays into the nose daily.   GenTeal PM 85-15 % Oint Apply to eye as directed.   ketoconazole 2 % cream Commonly known as: NIZORAL Apply 1 fingertip amount to each foot daily.   ketotifen 0.025 % ophthalmic solution Commonly known as: ZADITOR as directed.   latanoprost 0.005 % ophthalmic solution Commonly known as: XALATAN as directed.   loperamide 2 MG tablet Commonly known  as: IMODIUM A-D Take 2 mg by mouth 4 (four) times daily as needed for diarrhea or loose stools.   MIRALAX PO Take by mouth.   ondansetron 4 MG disintegrating tablet Commonly known as: Zofran ODT Take 1 tablet (4 mg total) by mouth every 8 (eight) hours as needed for nausea or vomiting.   PHENobarbital 97.2 MG tablet Commonly known as: LUMINAL Take 97.2 mg by mouth 2 (two) times daily.   timolol 0.5 % ophthalmic solution Commonly known  as: BETIMOL 1 drop right eye every morning   triazolam 0.25 MG tablet Commonly known as: HALCION Take 0.25 mg by mouth at bedtime as needed (PRIOR TO DENTAL PROCEDURES).   Vitamin D3 50 MCG (2000 UT) capsule Take 4,000 Units by mouth daily.       Past Surgical History:  He  has no past surgical history on file.  Family History:  His family history is not on file.  Social History:  He  reports that he has never smoked. He has never used smokeless tobacco. He reports that he does not drink alcohol.

## 2019-07-06 NOTE — Patient Instructions (Signed)
Follow up in 1 year.

## 2019-07-11 DIAGNOSIS — G40919 Epilepsy, unspecified, intractable, without status epilepticus: Secondary | ICD-10-CM | POA: Diagnosis not present

## 2019-07-11 DIAGNOSIS — I10 Essential (primary) hypertension: Secondary | ICD-10-CM | POA: Diagnosis not present

## 2019-07-11 DIAGNOSIS — Z6834 Body mass index (BMI) 34.0-34.9, adult: Secondary | ICD-10-CM | POA: Diagnosis not present

## 2019-07-11 DIAGNOSIS — F72 Severe intellectual disabilities: Secondary | ICD-10-CM | POA: Diagnosis not present

## 2019-08-09 DIAGNOSIS — I10 Essential (primary) hypertension: Secondary | ICD-10-CM | POA: Diagnosis not present

## 2019-08-09 DIAGNOSIS — G40919 Epilepsy, unspecified, intractable, without status epilepticus: Secondary | ICD-10-CM | POA: Diagnosis not present

## 2019-08-09 DIAGNOSIS — F72 Severe intellectual disabilities: Secondary | ICD-10-CM | POA: Diagnosis not present

## 2019-08-09 DIAGNOSIS — G4733 Obstructive sleep apnea (adult) (pediatric): Secondary | ICD-10-CM | POA: Diagnosis not present

## 2019-08-15 DIAGNOSIS — I10 Essential (primary) hypertension: Secondary | ICD-10-CM | POA: Diagnosis not present

## 2019-08-15 DIAGNOSIS — Z6829 Body mass index (BMI) 29.0-29.9, adult: Secondary | ICD-10-CM | POA: Diagnosis not present

## 2019-08-15 DIAGNOSIS — Z789 Other specified health status: Secondary | ICD-10-CM | POA: Diagnosis not present

## 2019-08-15 DIAGNOSIS — F72 Severe intellectual disabilities: Secondary | ICD-10-CM | POA: Diagnosis not present

## 2019-08-15 DIAGNOSIS — Z23 Encounter for immunization: Secondary | ICD-10-CM | POA: Diagnosis not present

## 2019-08-15 DIAGNOSIS — G40919 Epilepsy, unspecified, intractable, without status epilepticus: Secondary | ICD-10-CM | POA: Diagnosis not present

## 2019-09-05 DIAGNOSIS — F72 Severe intellectual disabilities: Secondary | ICD-10-CM | POA: Diagnosis not present

## 2019-09-05 DIAGNOSIS — I10 Essential (primary) hypertension: Secondary | ICD-10-CM | POA: Diagnosis not present

## 2019-09-05 DIAGNOSIS — G40919 Epilepsy, unspecified, intractable, without status epilepticus: Secondary | ICD-10-CM | POA: Diagnosis not present

## 2019-09-12 DIAGNOSIS — I10 Essential (primary) hypertension: Secondary | ICD-10-CM | POA: Diagnosis not present

## 2019-09-12 DIAGNOSIS — F72 Severe intellectual disabilities: Secondary | ICD-10-CM | POA: Diagnosis not present

## 2019-09-12 DIAGNOSIS — G40919 Epilepsy, unspecified, intractable, without status epilepticus: Secondary | ICD-10-CM | POA: Diagnosis not present

## 2019-11-03 DIAGNOSIS — Z23 Encounter for immunization: Secondary | ICD-10-CM | POA: Diagnosis not present

## 2019-11-20 DIAGNOSIS — F72 Severe intellectual disabilities: Secondary | ICD-10-CM | POA: Diagnosis not present

## 2019-11-20 DIAGNOSIS — I10 Essential (primary) hypertension: Secondary | ICD-10-CM | POA: Diagnosis not present

## 2019-11-20 DIAGNOSIS — G40919 Epilepsy, unspecified, intractable, without status epilepticus: Secondary | ICD-10-CM | POA: Diagnosis not present

## 2019-11-20 DIAGNOSIS — Z6829 Body mass index (BMI) 29.0-29.9, adult: Secondary | ICD-10-CM | POA: Diagnosis not present

## 2019-12-01 DIAGNOSIS — Z23 Encounter for immunization: Secondary | ICD-10-CM | POA: Diagnosis not present

## 2019-12-06 DIAGNOSIS — I10 Essential (primary) hypertension: Secondary | ICD-10-CM | POA: Diagnosis not present

## 2019-12-06 DIAGNOSIS — F72 Severe intellectual disabilities: Secondary | ICD-10-CM | POA: Diagnosis not present

## 2019-12-06 DIAGNOSIS — Z6829 Body mass index (BMI) 29.0-29.9, adult: Secondary | ICD-10-CM | POA: Diagnosis not present

## 2019-12-06 DIAGNOSIS — G40919 Epilepsy, unspecified, intractable, without status epilepticus: Secondary | ICD-10-CM | POA: Diagnosis not present

## 2019-12-29 DIAGNOSIS — Z79899 Other long term (current) drug therapy: Secondary | ICD-10-CM | POA: Diagnosis not present

## 2020-01-09 DIAGNOSIS — I10 Essential (primary) hypertension: Secondary | ICD-10-CM | POA: Diagnosis not present

## 2020-01-09 DIAGNOSIS — G40919 Epilepsy, unspecified, intractable, without status epilepticus: Secondary | ICD-10-CM | POA: Diagnosis not present

## 2020-01-09 DIAGNOSIS — Z6835 Body mass index (BMI) 35.0-35.9, adult: Secondary | ICD-10-CM | POA: Diagnosis not present

## 2020-01-09 DIAGNOSIS — Z7189 Other specified counseling: Secondary | ICD-10-CM | POA: Diagnosis not present

## 2020-01-09 DIAGNOSIS — F72 Severe intellectual disabilities: Secondary | ICD-10-CM | POA: Diagnosis not present

## 2020-01-09 DIAGNOSIS — Z03818 Encounter for observation for suspected exposure to other biological agents ruled out: Secondary | ICD-10-CM | POA: Diagnosis not present

## 2020-01-09 DIAGNOSIS — Z20828 Contact with and (suspected) exposure to other viral communicable diseases: Secondary | ICD-10-CM | POA: Diagnosis not present

## 2020-01-15 DIAGNOSIS — Z6835 Body mass index (BMI) 35.0-35.9, adult: Secondary | ICD-10-CM | POA: Diagnosis not present

## 2020-01-15 DIAGNOSIS — G40919 Epilepsy, unspecified, intractable, without status epilepticus: Secondary | ICD-10-CM | POA: Diagnosis not present

## 2020-01-15 DIAGNOSIS — K5901 Slow transit constipation: Secondary | ICD-10-CM | POA: Diagnosis not present

## 2020-01-15 DIAGNOSIS — I10 Essential (primary) hypertension: Secondary | ICD-10-CM | POA: Diagnosis not present

## 2020-01-15 DIAGNOSIS — F72 Severe intellectual disabilities: Secondary | ICD-10-CM | POA: Diagnosis not present

## 2020-02-13 DIAGNOSIS — H2513 Age-related nuclear cataract, bilateral: Secondary | ICD-10-CM | POA: Diagnosis not present

## 2020-02-27 DIAGNOSIS — F72 Severe intellectual disabilities: Secondary | ICD-10-CM | POA: Diagnosis not present

## 2020-02-27 DIAGNOSIS — I10 Essential (primary) hypertension: Secondary | ICD-10-CM | POA: Diagnosis not present

## 2020-02-27 DIAGNOSIS — Z6829 Body mass index (BMI) 29.0-29.9, adult: Secondary | ICD-10-CM | POA: Diagnosis not present

## 2020-02-27 DIAGNOSIS — G40919 Epilepsy, unspecified, intractable, without status epilepticus: Secondary | ICD-10-CM | POA: Diagnosis not present

## 2020-03-05 DIAGNOSIS — K5901 Slow transit constipation: Secondary | ICD-10-CM | POA: Diagnosis not present

## 2020-03-05 DIAGNOSIS — G40919 Epilepsy, unspecified, intractable, without status epilepticus: Secondary | ICD-10-CM | POA: Diagnosis not present

## 2020-03-05 DIAGNOSIS — F72 Severe intellectual disabilities: Secondary | ICD-10-CM | POA: Diagnosis not present

## 2020-03-05 DIAGNOSIS — I10 Essential (primary) hypertension: Secondary | ICD-10-CM | POA: Diagnosis not present

## 2020-03-05 DIAGNOSIS — Z6820 Body mass index (BMI) 20.0-20.9, adult: Secondary | ICD-10-CM | POA: Diagnosis not present

## 2020-03-14 DIAGNOSIS — Z6829 Body mass index (BMI) 29.0-29.9, adult: Secondary | ICD-10-CM | POA: Diagnosis not present

## 2020-03-14 DIAGNOSIS — G40919 Epilepsy, unspecified, intractable, without status epilepticus: Secondary | ICD-10-CM | POA: Diagnosis not present

## 2020-03-14 DIAGNOSIS — I10 Essential (primary) hypertension: Secondary | ICD-10-CM | POA: Diagnosis not present

## 2020-03-14 DIAGNOSIS — F72 Severe intellectual disabilities: Secondary | ICD-10-CM | POA: Diagnosis not present

## 2020-05-03 DIAGNOSIS — E785 Hyperlipidemia, unspecified: Secondary | ICD-10-CM | POA: Diagnosis not present

## 2020-05-03 DIAGNOSIS — E559 Vitamin D deficiency, unspecified: Secondary | ICD-10-CM | POA: Diagnosis not present

## 2020-05-03 DIAGNOSIS — Z0000 Encounter for general adult medical examination without abnormal findings: Secondary | ICD-10-CM | POA: Diagnosis not present

## 2020-05-03 DIAGNOSIS — Q909 Down syndrome, unspecified: Secondary | ICD-10-CM | POA: Diagnosis not present

## 2020-05-03 DIAGNOSIS — Z79899 Other long term (current) drug therapy: Secondary | ICD-10-CM | POA: Diagnosis not present

## 2020-05-27 DIAGNOSIS — Z0000 Encounter for general adult medical examination without abnormal findings: Secondary | ICD-10-CM | POA: Diagnosis not present

## 2020-05-27 DIAGNOSIS — Q909 Down syndrome, unspecified: Secondary | ICD-10-CM | POA: Diagnosis not present

## 2020-05-28 DIAGNOSIS — G40919 Epilepsy, unspecified, intractable, without status epilepticus: Secondary | ICD-10-CM | POA: Diagnosis not present

## 2020-05-28 DIAGNOSIS — I10 Essential (primary) hypertension: Secondary | ICD-10-CM | POA: Diagnosis not present

## 2020-05-28 DIAGNOSIS — F72 Severe intellectual disabilities: Secondary | ICD-10-CM | POA: Diagnosis not present

## 2020-05-28 DIAGNOSIS — Z6829 Body mass index (BMI) 29.0-29.9, adult: Secondary | ICD-10-CM | POA: Diagnosis not present

## 2020-06-26 DIAGNOSIS — I10 Essential (primary) hypertension: Secondary | ICD-10-CM | POA: Diagnosis not present

## 2020-06-26 DIAGNOSIS — G40919 Epilepsy, unspecified, intractable, without status epilepticus: Secondary | ICD-10-CM | POA: Diagnosis not present

## 2020-06-26 DIAGNOSIS — F72 Severe intellectual disabilities: Secondary | ICD-10-CM | POA: Diagnosis not present

## 2020-07-02 DIAGNOSIS — G40919 Epilepsy, unspecified, intractable, without status epilepticus: Secondary | ICD-10-CM | POA: Diagnosis not present

## 2020-07-02 DIAGNOSIS — Z20828 Contact with and (suspected) exposure to other viral communicable diseases: Secondary | ICD-10-CM | POA: Diagnosis not present

## 2020-07-02 DIAGNOSIS — Z03818 Encounter for observation for suspected exposure to other biological agents ruled out: Secondary | ICD-10-CM | POA: Diagnosis not present

## 2020-07-02 DIAGNOSIS — U071 COVID-19: Secondary | ICD-10-CM | POA: Diagnosis not present

## 2020-07-02 DIAGNOSIS — I10 Essential (primary) hypertension: Secondary | ICD-10-CM | POA: Diagnosis not present

## 2020-07-02 DIAGNOSIS — Z7189 Other specified counseling: Secondary | ICD-10-CM | POA: Diagnosis not present

## 2020-07-02 DIAGNOSIS — Z6829 Body mass index (BMI) 29.0-29.9, adult: Secondary | ICD-10-CM | POA: Diagnosis not present

## 2020-08-13 DIAGNOSIS — Z0001 Encounter for general adult medical examination with abnormal findings: Secondary | ICD-10-CM | POA: Diagnosis not present

## 2020-08-13 DIAGNOSIS — F72 Severe intellectual disabilities: Secondary | ICD-10-CM | POA: Diagnosis not present

## 2020-08-13 DIAGNOSIS — K5900 Constipation, unspecified: Secondary | ICD-10-CM | POA: Diagnosis not present

## 2020-08-13 DIAGNOSIS — G8389 Other specified paralytic syndromes: Secondary | ICD-10-CM | POA: Diagnosis not present

## 2020-08-13 DIAGNOSIS — G4733 Obstructive sleep apnea (adult) (pediatric): Secondary | ICD-10-CM | POA: Diagnosis not present

## 2020-08-13 DIAGNOSIS — G40909 Epilepsy, unspecified, not intractable, without status epilepticus: Secondary | ICD-10-CM | POA: Diagnosis not present

## 2020-08-13 DIAGNOSIS — Z6829 Body mass index (BMI) 29.0-29.9, adult: Secondary | ICD-10-CM | POA: Diagnosis not present

## 2020-08-13 DIAGNOSIS — M62838 Other muscle spasm: Secondary | ICD-10-CM | POA: Diagnosis not present

## 2020-08-13 DIAGNOSIS — H409 Unspecified glaucoma: Secondary | ICD-10-CM | POA: Diagnosis not present

## 2020-08-27 DIAGNOSIS — F72 Severe intellectual disabilities: Secondary | ICD-10-CM | POA: Diagnosis not present

## 2020-08-27 DIAGNOSIS — G40919 Epilepsy, unspecified, intractable, without status epilepticus: Secondary | ICD-10-CM | POA: Diagnosis not present

## 2020-08-27 DIAGNOSIS — I10 Essential (primary) hypertension: Secondary | ICD-10-CM | POA: Diagnosis not present

## 2020-09-10 DIAGNOSIS — G40919 Epilepsy, unspecified, intractable, without status epilepticus: Secondary | ICD-10-CM | POA: Diagnosis not present

## 2020-09-10 DIAGNOSIS — I10 Essential (primary) hypertension: Secondary | ICD-10-CM | POA: Diagnosis not present

## 2020-09-10 DIAGNOSIS — F72 Severe intellectual disabilities: Secondary | ICD-10-CM | POA: Diagnosis not present

## 2020-09-28 ENCOUNTER — Encounter: Payer: Self-pay | Admitting: Pulmonary Disease

## 2020-10-08 DIAGNOSIS — Z03818 Encounter for observation for suspected exposure to other biological agents ruled out: Secondary | ICD-10-CM | POA: Diagnosis not present

## 2020-10-08 DIAGNOSIS — Z20828 Contact with and (suspected) exposure to other viral communicable diseases: Secondary | ICD-10-CM | POA: Diagnosis not present

## 2020-10-08 DIAGNOSIS — Z6829 Body mass index (BMI) 29.0-29.9, adult: Secondary | ICD-10-CM | POA: Diagnosis not present

## 2020-10-08 DIAGNOSIS — G40919 Epilepsy, unspecified, intractable, without status epilepticus: Secondary | ICD-10-CM | POA: Diagnosis not present

## 2020-10-08 DIAGNOSIS — I10 Essential (primary) hypertension: Secondary | ICD-10-CM | POA: Diagnosis not present

## 2020-10-08 DIAGNOSIS — F72 Severe intellectual disabilities: Secondary | ICD-10-CM | POA: Diagnosis not present

## 2020-10-08 DIAGNOSIS — Z7189 Other specified counseling: Secondary | ICD-10-CM | POA: Diagnosis not present

## 2020-10-14 DIAGNOSIS — Z23 Encounter for immunization: Secondary | ICD-10-CM | POA: Diagnosis not present

## 2020-10-15 DIAGNOSIS — K219 Gastro-esophageal reflux disease without esophagitis: Secondary | ICD-10-CM | POA: Diagnosis not present

## 2020-10-15 DIAGNOSIS — K5900 Constipation, unspecified: Secondary | ICD-10-CM | POA: Diagnosis not present

## 2020-10-15 DIAGNOSIS — F72 Severe intellectual disabilities: Secondary | ICD-10-CM | POA: Diagnosis not present

## 2020-10-15 DIAGNOSIS — E559 Vitamin D deficiency, unspecified: Secondary | ICD-10-CM | POA: Diagnosis not present

## 2020-10-15 DIAGNOSIS — Z79899 Other long term (current) drug therapy: Secondary | ICD-10-CM | POA: Diagnosis not present

## 2020-10-15 DIAGNOSIS — Z6829 Body mass index (BMI) 29.0-29.9, adult: Secondary | ICD-10-CM | POA: Diagnosis not present

## 2020-11-06 ENCOUNTER — Ambulatory Visit: Payer: Medicare Other | Admitting: Pulmonary Disease

## 2021-01-28 ENCOUNTER — Other Ambulatory Visit: Payer: Self-pay

## 2021-01-28 ENCOUNTER — Encounter: Payer: Self-pay | Admitting: Pulmonary Disease

## 2021-01-28 ENCOUNTER — Ambulatory Visit (INDEPENDENT_AMBULATORY_CARE_PROVIDER_SITE_OTHER): Payer: Medicare Other | Admitting: Pulmonary Disease

## 2021-01-28 VITALS — BP 118/68 | HR 70 | Temp 97.4°F | Wt 159.2 lb

## 2021-01-28 DIAGNOSIS — G4734 Idiopathic sleep related nonobstructive alveolar hypoventilation: Secondary | ICD-10-CM

## 2021-01-28 NOTE — Progress Notes (Signed)
[facility]  Chief Complaint  Patient presents with  . Follow-up    Patient been refusing to wear cpap    Constitutional:  BP 118/68 (BP Location: Left Arm, Cuff Size: Normal)   Pulse 70   Temp (!) 97.4 F (36.3 C) (Temporal)   Wt 159 lb 3.2 oz (72.2 kg)   SpO2 98% Comment: Room air  BMI 26.49 kg/m   Past Medical History:  Severe mental retardation, Seizures, Scoliosis, Glaucoma, Cerebral palsy  Past Surgical History:  He  has no past surgical history on file.  Brief Summary:  Richard Oliver is a 55 y.o. male with obstructive sleep apnea and obesity hypoventilation syndrome.      Subjective:   He is here with his aide.  He resides at [facility].  He has not been able to use CPAP.  He doesn't like wearing the mask.  He has oxygen set up still, but hasn't been using since this was set up through his CPAP.  Speaking with nursing staff by phone at the facility, it does not seem that he will be able to resume CPAP.  He rips his mask off frequently, and then mask breaks.  It has been getting difficult to keep up with replacement masks.  Physical Exam:   Appearance - in wheelchair  ENMT - no sinus tenderness, no oral exudate, no LAN, Mallampati 3 airway, no stridor, garbled speech  Respiratory - equal breath sounds bilaterally, no wheezing or rales  CV - s1s2 regular rate and rhythm, no murmurs  Ext - no clubbing, no edema  Skin - no rashes  Psych - intermittently agitated   Sleep Tests:   PSG 12/28/07 >>AHI 35   CPAP 05/17/13 to 08/14/13 >> Used on 63 of 90 nights with average 2 hrs 42 min. Avearge AHI 0.7 with CPAP 17 cm H2O.  Social History:  He  reports that he has never smoked. He has never used smokeless tobacco. He reports that he does not drink alcohol.  Family History:  His family history is not on file.     Assessment/Plan:   Sleep related hypoxia/hypoventilation. - he was using 4 liters oxygen with  CPAP 17 cm H2O - he is no longer able to tolerate CPAP therapy - will arrange for overnight oximetry on room air, and then determine whether he could continue with supplemental oxygen at night  Time Spent Involved in Patient Care on Day of Examination:  24 minutes  Follow up:  Patient Instructions  Will arrange for overnight oxygen test  Can stop using CPAP  Follow up in 6 months   Medication List:   Allergies as of 01/28/2021   No Known Allergies     Medication List       Accurate as of January 28, 2021 12:11 PM. If you have any questions, ask your nurse or doctor.        STOP taking these medications   brimonidine 0.15 % ophthalmic solution Commonly known as: ALPHAGAN Stopped by: Chesley Mires, MD   docusate sodium 50 MG capsule Commonly known as: COLACE Stopped by: Chesley Mires, MD   fluticasone 50 MCG/ACT nasal spray Commonly known as: FLONASE Stopped by: Chesley Mires, MD   ketoconazole 2 % cream Commonly known as: NIZORAL Stopped by: Chesley Mires, MD   ketotifen 0.025 % ophthalmic solution Commonly known as: ZADITOR Stopped by: Chesley Mires, MD   [place] by: Chesley Mires, MD  TAKE these medications   benazepril 10 MG tablet Commonly known as: LOTENSIN Take 10 mg by mouth daily.   bisacodyl 10 MG suppository Commonly known as: DULCOLAX Place 10 mg rectally as needed.   chlorhexidine 0.12 % solution Commonly known as: PERIDEX as directed.   CHLOROPHYLL PO Take 3 mg by mouth 2 (two) times daily.   GenTeal PM 85-15 % Oint Apply to eye as directed.   LACRI-LUBE OP Apply to eye at bedtime. 1/4 inch strip to lower lid of both eyes at bedtime for eye care   latanoprost 0.005 % ophthalmic solution Commonly known as: XALATAN as directed.   loperamide 2 MG tablet Commonly known as: IMODIUM A-D Take 2 mg by mouth 4 (four) times daily as needed for diarrhea or loose stools.   ondansetron 4 MG disintegrating tablet Commonly known as:  Zofran ODT Take 1 tablet (4 mg total) by mouth every 8 (eight) hours as needed for nausea or vomiting.   PHENobarbital 97.2 MG tablet Commonly known as: LUMINAL Take 97.2 mg by mouth 2 (two) times daily.   promethazine 25 MG tablet Commonly known as: PHENERGAN Take 25 mg by mouth every 6 (six) hours as needed for nausea or vomiting.   promethazine 25 MG suppository Commonly known as: PHENERGAN Place 25 mg rectally every 6 (six) hours as needed for nausea or vomiting.   senna 8.6 MG tablet Commonly known as: SENOKOT Take 1 tablet by mouth daily.   Simbrinza 1-0.2 % Susp Generic drug: Brinzolamide-Brimonidine Apply to eye. Instill 1 drop in right eye three times a day for glaucoma   timolol 0.5 % ophthalmic solution Commonly known as: BETIMOL 1 drop right eye every morning   triazolam 0.25 MG tablet Commonly known as: HALCION Take 0.25 mg by mouth at bedtime as needed (PRIOR TO DENTAL PROCEDURES).   Trulance 3 MG Tabs Generic drug: Plecanatide Take 1 tablet by mouth daily.   Vitamin D3 50 MCG (2000 UT) capsule Take 4,000 Units by mouth daily.       Signature:  Chesley Mires, MD [place] Pager - [phone] 01/28/2021, 12:11 PM

## 2021-01-28 NOTE — Patient Instructions (Signed)
Will arrange for overnight oxygen test  Can stop using CPAP  Follow up in 6 months

## 2021-02-25 ENCOUNTER — Other Ambulatory Visit: Payer: Self-pay

## 2021-02-25 ENCOUNTER — Telehealth: Payer: Self-pay | Admitting: Pulmonary Disease

## 2021-02-25 DIAGNOSIS — G4734 Idiopathic sleep related nonobstructive alveolar hypoventilation: Secondary | ICD-10-CM

## 2021-02-25 NOTE — Telephone Encounter (Signed)
ONO with RA 02/12/21 >> test time 9 hrs 50 min.  Baseline SpO2 92%, low SpO2 64%.  Spent 1 hr 30 min with SpO2 < 88%.   Please let his caregiver know his oxygen level is low at night.  Please send order to have DME arrange for new oxygen set up with 2 liters at night.

## 2021-02-25 NOTE — Telephone Encounter (Signed)
Called and spoke with caregiver per Dr Halford Chessman at [facility], nurse named Lutricia Feil and went over ONO results per Dr Halford Chessman. Agreeable with order being placed for new oxygen set up O2 at 2 Liters at night. Order placed per Dr Halford Chessman. Tilacia stated she would follow up with group home to make aware of new order and will call back if any issues but stated to go ahead and order set up. Will route to Dr Halford Chessman as Juluis Rainier.

## 2021-03-25 ENCOUNTER — Telehealth: Payer: Self-pay | Admitting: Pulmonary Disease

## 2021-03-25 NOTE — Telephone Encounter (Signed)
Called Richard Oliver w/ [employer] to check on the status of O2 order. States order hasn't been fulfilled but she will work on it now. Called Richard Oliver and informed her of what [employer] said. States understand. Nothing further needed.

## 2021-07-18 ENCOUNTER — Emergency Department (HOSPITAL_COMMUNITY): Payer: Medicare Other

## 2021-07-18 ENCOUNTER — Encounter (HOSPITAL_COMMUNITY): Payer: Self-pay

## 2021-07-18 ENCOUNTER — Other Ambulatory Visit: Payer: Self-pay

## 2021-07-18 ENCOUNTER — Emergency Department (HOSPITAL_COMMUNITY)
Admission: EM | Admit: 2021-07-18 | Discharge: 2021-07-18 | Disposition: A | Discharge status: Home / Self Care | Payer: Medicare Other | Attending: Emergency Medicine | Admitting: Emergency Medicine

## 2021-07-18 DIAGNOSIS — Y92410 Unspecified street and highway as the place of occurrence of the external cause: Secondary | ICD-10-CM | POA: Diagnosis not present

## 2021-07-18 DIAGNOSIS — R519 Headache, unspecified: Secondary | ICD-10-CM | POA: Diagnosis present
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
## 2021-07-18 NOTE — Discharge Instructions (Addendum)
Imaging and exam are reassuring.  Recommend over-the-counter pain medications as needed.  Please follow-up with PCP for further evaluation if needed  Come back to the emergency department if you develop chest pain, shortness of breath, severe abdominal pain, uncontrolled nausea, vomiting, diarrhea.

## 2021-07-18 NOTE — ED Notes (Signed)
Caregiver at bedside, awaiting pt's wheelchair and Lucianne Lei arrival.
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
## 2022-05-27 ENCOUNTER — Telehealth: Payer: Self-pay | Admitting: Pulmonary Disease

## 2022-05-28 NOTE — Telephone Encounter (Signed)
Pt has an upcoming appt scheduled with Dr. Halford Chessman 8/23. Will update the appt notes to show that pt will need to be recertified for O2. Nothing further needed.

## 2022-06-17 ENCOUNTER — Encounter: Payer: Self-pay | Admitting: Pulmonary Disease

## 2022-06-17 ENCOUNTER — Ambulatory Visit (INDEPENDENT_AMBULATORY_CARE_PROVIDER_SITE_OTHER): Payer: Medicare Other | Admitting: Pulmonary Disease

## 2022-06-17 VITALS — BP 130/80 | HR 68 | Ht 62.0 in | Wt 203.6 lb

## 2022-06-17 DIAGNOSIS — G4734 Idiopathic sleep related nonobstructive alveolar hypoventilation: Secondary | ICD-10-CM

## 2022-06-17 DIAGNOSIS — G4733 Obstructive sleep apnea (adult) (pediatric): Secondary | ICD-10-CM | POA: Diagnosis not present

## 2022-06-17 NOTE — Patient Instructions (Signed)
Will arrange for home sleep study Will call to arrange for follow up after sleep study reviewed  

## 2022-06-17 NOTE — Progress Notes (Signed)
Pulmonary, Critical Care, and Sleep Medicine  Chief Complaint  Patient presents with   Follow-up    Nocturnal hypoxemia    Constitutional:  BP 130/80 (BP Location: Left Arm)   Pulse 68   Ht '5\' 2"'$  (1.575 m)   Wt 203 lb 9.6 oz (92.4 kg)   SpO2 98%   BMI 37.24 kg/m   Past Medical History:  Severe mental retardation, Seizures, Scoliosis, Glaucoma, Cerebral palsy, COVID April 2023  Past Surgical History:  He  has no past surgical history on file.  Brief Summary:  Richard Oliver is a 56 y.o. male with obstructive sleep apnea and obesity hypoventilation syndrome.      Subjective:   He is here with his aide.  He resides at [facility].  Staff at his facility is concerned that he is snoring more and is more restless at night.  He is using 2 liters oxygen at night.  He tried CPAP/Bipap before, but wasn't able to tolerate wearing any masks.  Physical Exam:   Appearance - in wheelchair  ENMT - no sinus tenderness, no oral exudate, no LAN, Mallampati 3 airway, no stridor, garbled speech  Respiratory - equal breath sounds bilaterally, no wheezing or rales  CV - s1s2 regular rate and rhythm, no murmurs  Ext - no clubbing, no edema  Skin - no rashes  Psych - intermittently agitated   Sleep Tests:  PSG 12/28/07 >> AHI 35  CPAP 05/17/13 to 08/14/13 >> Used on 63 of 90 nights with average 2 hrs 42 min. Avearge AHI 0.7 with CPAP 17 cm H2O. ONO with RA 02/12/21 >> test time 9 hrs 50 min.  Baseline SpO2 92%, low SpO2 64%.  Spent 1 hr 30 min with SpO2 < 88%.  Social History:  He  reports that he has never smoked. He has never used smokeless tobacco. He reports that he does not drink alcohol.  Family History:  His family history is not on file.     Assessment/Plan:   Sleep related hypoxia/hypoventilation. - currently using 2 liters oxygen at night - was unable to tolerate PAP therapy before, and wouldn't want to try again - will arrange a home sleep study and  then determine if he might warrant ENT assessment to see if he can get an Inspire device  Time Spent Involved in Patient Care on Day of Examination:  27 minutes  Follow up:   Patient Instructions  Will arrange for home sleep study Will call to arrange for follow up after sleep study reviewed   Medication List:   Allergies as of 06/17/2022   No Known Allergies      Medication List        Accurate as of June 17, 2022 11:08 AM. If you have any questions, ask your nurse or doctor.          benazepril 10 MG tablet Commonly known as: LOTENSIN Take 10 mg by mouth daily.   bisacodyl 10 MG suppository Commonly known as: DULCOLAX Place 10 mg rectally as needed.   chlorhexidine 0.12 % solution Commonly known as: PERIDEX as directed.   CHLOROPHYLL PO Take 3 mg by mouth 2 (two) times daily.   LACRI-LUBE OP Apply to eye at bedtime. 1/4 inch strip to lower lid of both eyes at bedtime for eye care   latanoprost 0.005 % ophthalmic solution Commonly known as: XALATAN as directed.   loperamide 2 MG tablet Commonly known as: IMODIUM A-D Take 2 mg by mouth  4 (four) times daily as needed for diarrhea or loose stools.   ondansetron 4 MG disintegrating tablet Commonly known as: Zofran ODT Take 1 tablet (4 mg total) by mouth every 8 (eight) hours as needed for nausea or vomiting.   PHENobarbital 97.2 MG tablet Commonly known as: LUMINAL Take 97.2 mg by mouth 2 (two) times daily.   promethazine 25 MG tablet Commonly known as: PHENERGAN Take 25 mg by mouth every 6 (six) hours as needed for nausea or vomiting.   promethazine 25 MG suppository Commonly known as: PHENERGAN Place 25 mg rectally every 6 (six) hours as needed for nausea or vomiting.   senna 8.6 MG tablet Commonly known as: SENOKOT Take 1 tablet by mouth daily.   Simbrinza 1-0.2 % Susp Generic drug: Brinzolamide-Brimonidine Apply to eye. Instill 1 drop in right eye three times a day for glaucoma    timolol 0.5 % ophthalmic solution Commonly known as: BETIMOL 1 drop right eye every morning   triazolam 0.25 MG tablet Commonly known as: HALCION Take 0.25 mg by mouth at bedtime as needed (PRIOR TO DENTAL PROCEDURES).   Trulance 3 MG Tabs Generic drug: Plecanatide Take 1 tablet by mouth daily.   Vitamin D3 50 MCG (2000 UT) capsule Take 4,000 Units by mouth daily.        Signature:  Chesley Mires, MD [place] Pager - [phone] 06/17/2022, 11:08 AM

## 2022-08-14 ENCOUNTER — Telehealth: Payer: Self-pay | Admitting: Pulmonary Disease

## 2022-08-14 NOTE — Telephone Encounter (Signed)
He is not able to do an in lab sleep study.  He can have home sleep study done off of supplemental oxygen.

## 2022-08-14 NOTE — Telephone Encounter (Signed)
-----   Message from Richard Oliver sent at 08/05/2022 12:11 PM EDT ----- Regarding: HST cancel need in lab study order Hello Dr Darlis Loan to patient's resident nurse,patient is on 24/hr oxygen Unable to do hst. Please order in lab study   Please advice  Collette

## 2022-08-27 NOTE — Telephone Encounter (Signed)
I called [facility] (group home) and spoke to [place].  She is going to give the nurse [name] a message and let her know VS wants pt to have hst and just be off O2 the night of the study.  Order was placed on 8/23 and I haven't gotten to that date yet.  Told her to let [name] know I will be calling her back to schedule.  Nothing further needed at this time.

## 2022-09-29 ENCOUNTER — Ambulatory Visit: Payer: Medicare Other

## 2022-09-29 DIAGNOSIS — G4733 Obstructive sleep apnea (adult) (pediatric): Secondary | ICD-10-CM

## 2022-10-09 ENCOUNTER — Telehealth: Payer: Self-pay | Admitting: Pulmonary Disease

## 2022-10-09 DIAGNOSIS — G4733 Obstructive sleep apnea (adult) (pediatric): Secondary | ICD-10-CM | POA: Diagnosis not present

## 2022-10-09 NOTE — Telephone Encounter (Signed)
HST 09/29/22 >> AHI 50.2, SpO2 low 59%  Please inform him that his sleep study shows severe obstructive sleep apnea.  Please arrange for ROV with me or NP to discuss treatment options.

## 2022-10-12 NOTE — Telephone Encounter (Signed)
09/29/2022 Raven did the sleep study at [facility]

## 2022-10-12 NOTE — Telephone Encounter (Signed)
Richard Oliver w/[facility] needs to speak with a nurse regarding results. Please call tamika back at [phone]

## 2022-10-12 NOTE — Telephone Encounter (Signed)
Pls fax over results of last testing to : [phone] [name] w/[facility] [phone] is her contact #

## 2022-10-12 NOTE — Telephone Encounter (Signed)
Ladies,  Did this patient ever do the HST. Looks like Dr Halford Chessman ordered it back in August.   And when I look under the procedures tab on my end it says HST on 09/29/2022 but looks like nothing is scanned in.   Can you determine if the HST was ever done?  Thank you

## 2022-10-13 NOTE — Telephone Encounter (Signed)
Spoke with [name], case Insurance underwriter.  Dr. Juanetta Gosling results and recommendations given.  [name] stated facility will call office to schedule follow up OV with Dr. Halford Chessman or NP.

## 2022-11-05 ENCOUNTER — Ambulatory Visit (INDEPENDENT_AMBULATORY_CARE_PROVIDER_SITE_OTHER): Payer: Medicare Other | Admitting: Primary Care

## 2022-11-05 ENCOUNTER — Encounter: Payer: Self-pay | Admitting: Primary Care

## 2022-11-05 VITALS — BP 118/70 | HR 67

## 2022-11-05 DIAGNOSIS — G4733 Obstructive sleep apnea (adult) (pediatric): Secondary | ICD-10-CM

## 2022-11-05 NOTE — Progress Notes (Signed)
$'@Patient'o$  ID: Richard Oliver, male    DOB: 06-13-1966, 57 y.o.   MRN: [id number]  Chief Complaint  Patient presents with   Follow-up    Sleep study results    Referring provider: Royals, Jenness Corner, MD  HPI: 57 year old male, never smoked.  Past medical history significant for severe mental retardation, seizures, scoliosis, glaucoma, cerebral palsy, OSA.  Patient of Dr. Halford Chessman, last seen in office on 06/17/2022.  Previous [facility] pulmonary encounter: 06/17/22- Dr. Halford Chessman  He is here with his aide.  He resides at [facility].   Staff at his facility is concerned that he is snoring more and is more restless at night.  He is using 2 liters oxygen at night.  He tried CPAP/Bipap before, but wasn't able to tolerate wearing any masks.   11/05/2022- Interim hx  Patient presents today for to review sleep study results. Patient lives at [facility]. Accompanied by facility member today. He was seen in August for sleep consult d/t snoring. He had home sleep study on 09/29/2022 that showed evidence of severe obstructive sleep apnea, AHI 50.2 an hour with SpO2 low 59% (average 85%). We reviewed risk of untreated sleep apnea and treatment options. He is reluctant to wear CPAP but will be trying with the help of his facility.    Sleep test: PSG 12/28/07 >> AHI 35  CPAP 05/17/13 to 08/14/13 >> Used on 63 of 90 nights with average 2 hrs 42 min. Avearge AHI 0.7 with CPAP 17 cm H2O. ONO with RA 02/12/21 >> test time 9 hrs 50 min.  Baseline SpO2 92%, low SpO2 64%.  Spent 1 hr 30 min with SpO2 < 88%.   No Known Allergies  Immunization History  Administered Date(s) Administered   Influenza Whole 07/20/2012    Past Medical History:  Diagnosis Date   Anisometropia    Cerebral palsy ([place])    Glaucoma    Myopia    OSA (obstructive sleep apnea)    Quadriplegia ([facility])    Scoliosis    Seizure disorder ([facility])    Severe mental retardation    Sleep-related hypoventilation 07/08/2009    Tobacco History: Social  History   Tobacco Use  Smoking Status Never  Smokeless Tobacco Never   Counseling given: Not Answered   Outpatient Medications Prior to Visit  Medication Sig Dispense Refill   Artificial Tear Ointment (LACRI-LUBE OP) Apply to eye at bedtime. 1/4 inch strip to lower lid of both eyes at bedtime for eye care     benazepril (LOTENSIN) 10 MG tablet Take 10 mg by mouth daily.     bisacodyl (DULCOLAX) 10 MG suppository Place 10 mg rectally as needed.     Brinzolamide-Brimonidine (SIMBRINZA) 1-0.2 % SUSP Apply to eye. Instill 1 drop in right eye three times a day for glaucoma     chlorhexidine (PERIDEX) 0.12 % solution as directed.     CHLOROPHYLL PO Take 3 mg by mouth 2 (two) times daily.     Cholecalciferol (VITAMIN D3) 2000 units capsule Take 4,000 Units by mouth daily.     latanoprost (XALATAN) 0.005 % ophthalmic solution as directed.     loperamide (IMODIUM A-D) 2 MG tablet Take 2 mg by mouth 4 (four) times daily as needed for diarrhea or loose stools.     ondansetron (ZOFRAN ODT) 4 MG disintegrating tablet Take 1 tablet (4 mg total) by mouth every 8 (eight) hours as needed for nausea or vomiting. 20 tablet 0   PHENobarbital (LUMINAL) 97.2  MG tablet Take 97.2 mg by mouth 2 (two) times daily.     Plecanatide (TRULANCE) 3 MG TABS Take 1 tablet by mouth daily.     promethazine (PHENERGAN) 25 MG suppository Place 25 mg rectally every 6 (six) hours as needed for nausea or vomiting.     promethazine (PHENERGAN) 25 MG tablet Take 25 mg by mouth every 6 (six) hours as needed for nausea or vomiting.     senna (SENOKOT) 8.6 MG tablet Take 1 tablet by mouth daily.     timolol (BETIMOL) 0.5 % ophthalmic solution 1 drop right eye every morning     triazolam (HALCION) 0.25 MG tablet Take 0.25 mg by mouth at bedtime as needed (PRIOR TO DENTAL PROCEDURES). (Patient not taking: Reported on 01/28/2021)     No facility-administered medications prior to visit.   Review of Systems  Review of Systems   Constitutional: Negative.   HENT: Negative.    Respiratory: Negative.      Physical Exam  BP 118/70 (BP Location: Right Arm, Cuff Size: Normal)   Pulse 67   SpO2 97%  Physical Exam Constitutional:      Appearance: Normal appearance.  HENT:     Head: Normocephalic and atraumatic.  Cardiovascular:     Rate and Rhythm: Normal rate and regular rhythm.  Pulmonary:     Effort: Pulmonary effort is normal. No respiratory distress.     Comments: No overt rhonchi, rales or wheezing  Musculoskeletal:     Comments: Scoliosis; In [facility]  Neurological:     General: No focal deficit present.     Mental Status: He is alert. Mental status is at baseline.      Lab Results:  CBC No results found for: "WBC", "RBC", "HGB", "HCT", "PLT", "MCV", "MCH", "MCHC", "RDW", "LYMPHSABS", "MONOABS", "EOSABS", "BASOSABS"  BMET No results found for: "NA", "K", "CL", "CO2", "GLUCOSE", "BUN", "CREATININE", "CALCIUM", "GFRNONAA", "GFRAA"  BNP No results found for: "BNP"  ProBNP No results found for: "PROBNP"  Imaging: No results found.   Assessment & Plan:   OBSTRUCTIVE SLEEP APNEA - Hx OSA. Patient has symptoms of snoring and restless sleep.  Repeat home sleep study on 09/26/2022 showed severe obstructive sleep apnea, AHI 50.2/hour with Spo2 low 59% (baseline 85%).  We reviewed risks of untreated sleep apnea and treatment options.  Recommending patient be started on CPAP due to severity of his OSA, he is reluctant to start PAP therapy but will try with the help of staff from his facility. He would not likely tolerate CPAP titration study in-lab. DME order placed for patient to receive new CPAP machine auto pressure 10-20cm h20 H2O.  Encourage patient elevate head of bed while sleeping at night.  He will need overnight oximetry test once on CPAP to determine if patient needs oxygen.  Follow-up in 8 weeks for CPAP compliance check.    Martyn Ehrich, NP 11/05/2022

## 2022-11-05 NOTE — Progress Notes (Signed)
Reviewed and agree with assessment/plan.   Chesley Mires, MD [facility] Pulmonary/Critical Care 11/05/2022, 12:12 PM Pager:  [phone]

## 2022-11-05 NOTE — Assessment & Plan Note (Signed)
-   Hx OSA. Patient has symptoms of snoring and restless sleep.  Repeat home sleep study on 09/26/2022 showed severe obstructive sleep apnea, AHI 50.2/hour with Spo2 low 59% (baseline 85%).  We reviewed risks of untreated sleep apnea and treatment options.  Recommending patient be started on CPAP due to severity of his OSA, he is reluctant to start PAP therapy but will try with the help of staff from his facility. He would not likely tolerate CPAP titration study in-lab. DME order placed for patient to receive new CPAP machine auto pressure 10-20cm h20 H2O.  Encourage patient elevate head of bed while sleeping at night.  He will need overnight oximetry test once on CPAP to determine if patient needs oxygen.  Follow-up in 8 weeks for CPAP compliance check.

## 2022-11-05 NOTE — Patient Instructions (Addendum)
Home sleep study on 09/29/2022 that showed evidence of severe obstructive sleep apnea  Recommendations: - Focus on side sleeping position OR elevate head with wedge pillow if back sleeper - Avoid alcohol or sedating medication prior to bedtime as these can worsen sleep apnea  - Once you get CPAP aim to wear EVERY night for 4-6 hours or more   Orders: - Auto CPAP 10-20cm h20, mask of choice- recommending full face mask  (sleep study 09/29/22 re: snoring)  Follow-up: - 8 weeks with Dr. Halford Chessman or [facility] NP for CPAP compliance check    CPAP and BIPAP Information CPAP and BIPAP are methods that use air pressure to keep your airways open and to help you breathe well. CPAP and BIPAP use different amounts of pressure. Your health care provider will tell you whether CPAP or BIPAP would be more helpful for you. CPAP stands for "continuous positive airway pressure." With CPAP, the amount of pressure stays the same while you breathe in (inhale) and out (exhale). BIPAP stands for "bi-level positive airway pressure." With BIPAP, the amount of pressure will be higher when you inhale and lower when you exhale. This allows you to take larger breaths. CPAP or BIPAP may be used in the hospital, or your health care provider may want you to use it at home. You may need to have a sleep study before your health care provider can order a machine for you to use at home. What are the advantages? CPAP or BIPAP can be helpful if you have: Sleep apnea. Chronic obstructive pulmonary disease (COPD). Heart failure. Medical conditions that cause muscle weakness, including muscular dystrophy or amyotrophic lateral sclerosis (ALS). Other problems that cause breathing to be shallow, weak, abnormal, or difficult. CPAP and BIPAP are most commonly used for obstructive sleep apnea (OSA) to keep the airways from collapsing when the muscles relax during sleep. What are the risks? Generally, this is a safe treatment. However, problems  may occur, including: Irritated skin or skin sores if the mask does not fit properly. Dry or stuffy nose or nosebleeds. Dry mouth. Feeling gassy or bloated. Sinus or lung infection if the equipment is not cleaned properly. When should CPAP or BIPAP be used? In most cases, the mask only needs to be worn during sleep. Generally, the mask needs to be worn throughout the night and during any daytime naps. People with certain medical conditions may also need to wear the mask at other times, such as when they are awake. Follow instructions from your health care provider about when to use the machine. What happens during CPAP or BIPAP?  Both CPAP and BIPAP are provided by a small machine with a flexible plastic tube that attaches to a plastic mask that you wear. Air is blown through the mask into your nose or mouth. The amount of pressure that is used to blow the air can be adjusted on the machine. Your health care provider will set the pressure setting and help you find the best mask for you. Tips for using the mask Because the mask needs to be snug, some people feel trapped or closed-in (claustrophobic) when first using the mask. If you feel this way, you may need to get used to the mask. One way to do this is to hold the mask loosely over your nose or mouth and then gradually apply the mask more snugly. You can also gradually increase the amount of time that you use the mask. Masks are available in various types and  sizes. If your mask does not fit well, talk with your health care provider about getting a different one. Some common types of masks include: Full face masks, which fit over the mouth and nose. Nasal masks, which fit over the nose. Nasal pillow or prong masks, which fit into the nostrils. If you are using a mask that fits over your nose and you tend to breathe through your mouth, a chin strap may be applied to help keep your mouth closed. Use a skin barrier to protect your skin as told by  your health care provider. Some CPAP and BIPAP machines have alarms that may sound if the mask comes off or develops a leak. If you have trouble with the mask, it is very important that you talk with your health care provider about finding a way to make the mask easier to tolerate. Do not stop using the mask. There could be a negative impact on your health if you stop using the mask. Tips for using the machine Place your CPAP or BIPAP machine on a secure table or stand near an electrical outlet. Know where the on/off switch is on the machine. Follow instructions from your health care provider about how to set the pressure on your machine and when you should use it. Do not eat or drink while the CPAP or BIPAP machine is on. Food or fluids could get pushed into your lungs by the pressure of the CPAP or BIPAP. For home use, CPAP and BIPAP machines can be rented or purchased through home health care companies. Many different brands of machines are available. Renting a machine before purchasing may help you find out which particular machine works well for you. Your health insurance company may also decide which machine you may get. Keep the CPAP or BIPAP machine and attachments clean. Ask your health care provider for specific instructions. Check the humidifier if you have a dry stuffy nose or nosebleeds. Make sure it is working correctly. Follow these instructions at home: Take over-the-counter and prescription medicines only as told by your health care provider. Ask if you can take sinus medicine if your sinuses are blocked. Do not use any products that contain nicotine or tobacco. These products include cigarettes, chewing tobacco, and vaping devices, such as e-cigarettes. If you need help quitting, ask your health care provider. Keep all follow-up visits. This is important. Contact a health care provider if: You have redness or pressure sores on your head, face, mouth, or nose from the mask or head  gear. You have trouble using the CPAP or BIPAP machine. You cannot tolerate wearing the CPAP or BIPAP mask. Someone tells you that you snore even when wearing your CPAP or BIPAP. Get help right away if: You have trouble breathing. You feel confused. Summary CPAP and BIPAP are methods that use air pressure to keep your airways open and to help you breathe well. If you have trouble with the mask, it is very important that you talk with your health care provider about finding a way to make the mask easier to tolerate. Do not stop using the mask. There could be a negative impact to your health if you stop using the mask. Follow instructions from your health care provider about when to use the machine. This information is not intended to replace advice given to you by your health care provider. Make sure you discuss any questions you have with your health care provider. Document Revised: 05/21/2021 Document Reviewed: 09/20/2020 Elsevier Patient Education  [place].

## 2022-11-30 ENCOUNTER — Telehealth: Payer: Self-pay | Admitting: Primary Care

## 2022-11-30 NOTE — Telephone Encounter (Signed)
Beth,  Can you confirm if patient needs to be wearing oxygen with his cpap?  I see the order for the cpap but it looks like the o2 order was back in 2022. So I wanted to make sure with you first  Thank you

## 2022-11-30 NOTE — Telephone Encounter (Signed)
RN asking if pt needs to be using O2 with his CPAP, asking if nurse or provider can reach out directly    She can be reached at [phone]

## 2022-11-30 NOTE — Telephone Encounter (Signed)
If he is wearing CPAP he does not need to wear oxygen with it right now, would check ono on CPAP on RA to ensure he does not need to use oxygen in addition to cpap   If not wearing CPAP then yes he needs to wear oxygen at night.

## 2022-12-01 NOTE — Telephone Encounter (Signed)
ATC LMTCB 12/01/22 @ 4:41 PM

## 2022-12-02 NOTE — Telephone Encounter (Signed)
ATC X1 left detailed message with Nurse at group home. Advised her to call back with any questions.

## 2022-12-31 ENCOUNTER — Ambulatory Visit (INDEPENDENT_AMBULATORY_CARE_PROVIDER_SITE_OTHER): Payer: Medicare Other | Admitting: Primary Care

## 2022-12-31 ENCOUNTER — Encounter: Payer: Self-pay | Admitting: Primary Care

## 2022-12-31 VITALS — BP 122/74 | HR 74 | Temp 97.6°F

## 2022-12-31 DIAGNOSIS — G4733 Obstructive sleep apnea (adult) (pediatric): Secondary | ICD-10-CM

## 2022-12-31 NOTE — Patient Instructions (Addendum)
Patient is intolerant to CPAP, he appears interested in possible surgical options for treatment of sleep apnea/Inspire device. Unclear if he would be a candidate but I think its reasonable to have an evaluation   Recommendations: Continue to wear 3L oxygen at bedtime   Orders: Discontinue CPAP  Referral: ENT   Follow-up: 3 months with Dr. Halford Chessman ([place] location)

## 2022-12-31 NOTE — Progress Notes (Signed)
$'@Patient'i$  ID: Richard Oliver, male    DOB: [birth year], 57 y.o.   MRN: [id number]  Chief Complaint  Patient presents with   Follow-up    Patient refuses to use CPAP.  Patient is residence at [facility].    Referring provider: Royals, Jenness Corner, MD  HPI: 57 year old male, never smoked.  Past medical history significant for severe mental retardation, seizures, scoliosis, glaucoma, cerebral palsy, OSA.  Patient of Dr. Halford Chessman, last seen in office on 06/17/2022.  Previous [facility] pulmonary encounter: 06/17/22- Dr. Halford Chessman  He is here with his aide.  He resides at [facility].   Staff at his facility is concerned that he is snoring more and is more restless at night.  He is using 2 liters oxygen at night.  He tried CPAP/Bipap before, but wasn't able to tolerate wearing any masks.   11/05/2022 Patient presents today for to review sleep study results. Patient lives at [facility]. Accompanied by facility member today. He was seen in August for sleep consult d/t snoring. He had home sleep study on 09/29/2022 that showed evidence of severe obstructive sleep apnea, AHI 50.2 an hour with SpO2 low 59% (average 85%). We reviewed risk of untreated sleep apnea and treatment options. He is reluctant to wear CPAP but will be trying with the help of his facility.   12/31/2022 Patient presents today for OSA follow-up.  Accompanied by group home staff. He has history of severe obstructive sleep apnea, most recent sleep study was done in December 2023, AHI 50.2 an hour with SpO2 low 59% (average 85%).  He is intolerant to CPAP and refusing to continue use. He is not open to trying a different mask or adjusting pressure settings. He is amendable to wearing oxygen at bedtime.  We discussed alternative treatment options to OSA.  He does express interest in surgical options for treatment of OSA, I am unsure if he will be a candidate but reasonable to have an evaluation with ear nose and throat.   Airview download  11/09/22-12/08/22 Usage 5/30 days; 2 days > 4 hours  Pressure 10-20cm h20 (9 cm h20) Airleaks 97L/min (95%) AHI 15.6  Sleep test: PSG 12/28/07 >> AHI 35  CPAP 05/17/13 to 08/14/13 >> Used on 63 of 90 nights with average 2 hrs 42 min. Avearge AHI 0.7 with CPAP 17 cm H2O. ONO with RA 02/12/21 >> test time 9 hrs 50 min.  Baseline SpO2 92%, low SpO2 64%.  Spent 1 hr 30 min with SpO2 < 88%.  No Known Allergies  Immunization History  Administered Date(s) Administered   Influenza Whole 07/20/2012    Past Medical History:  Diagnosis Date   Anisometropia    Cerebral palsy ([place])    Glaucoma    Myopia    OSA (obstructive sleep apnea)    Quadriplegia ([facility])    Scoliosis    Seizure disorder ([facility])    Severe mental retardation    Sleep-related hypoventilation 07/08/2009    Tobacco History: Social History   Tobacco Use  Smoking Status Never  Smokeless Tobacco Never   Counseling given: Not Answered   Outpatient Medications Prior to Visit  Medication Sig Dispense Refill   Artificial Tear Ointment (LACRI-LUBE OP) Apply to eye at bedtime. 1/4 inch strip to lower lid of both eyes at bedtime for eye care     benazepril (LOTENSIN) 10 MG tablet Take 10 mg by mouth daily.     bisacodyl (DULCOLAX) 10 MG suppository Place 10 mg rectally  as needed.     Brinzolamide-Brimonidine (SIMBRINZA) 1-0.2 % SUSP Apply to eye. Instill 1 drop in right eye three times a day for glaucoma     chlorhexidine (PERIDEX) 0.12 % solution as directed.     CHLOROPHYLL PO Take 3 mg by mouth 2 (two) times daily.     Cholecalciferol (VITAMIN D3) 2000 units capsule Take 4,000 Units by mouth daily.     latanoprost (XALATAN) 0.005 % ophthalmic solution as directed.     LINZESS 145 MCG CAPS capsule Take 145 mcg by mouth daily.     loperamide (IMODIUM A-D) 2 MG tablet Take 2 mg by mouth 4 (four) times daily as needed for diarrhea or loose stools.     ondansetron (ZOFRAN ODT) 4 MG disintegrating tablet Take 1 tablet (4 mg total)  by mouth every 8 (eight) hours as needed for nausea or vomiting. 20 tablet 0   PHENobarbital (LUMINAL) 97.2 MG tablet Take 97.2 mg by mouth 2 (two) times daily.     Plecanatide (TRULANCE) 3 MG TABS Take 1 tablet by mouth daily.     promethazine (PHENERGAN) 25 MG suppository Place 25 mg rectally every 6 (six) hours as needed for nausea or vomiting.     promethazine (PHENERGAN) 25 MG tablet Take 25 mg by mouth every 6 (six) hours as needed for nausea or vomiting.     senna (SENOKOT) 8.6 MG tablet Take 1 tablet by mouth daily.     timolol (BETIMOL) 0.5 % ophthalmic solution 1 drop right eye every morning     triazolam (HALCION) 0.25 MG tablet Take 0.25 mg by mouth at bedtime as needed (PRIOR TO DENTAL PROCEDURES).     No facility-administered medications prior to visit.   Review of Systems  Review of Systems  Constitutional: Negative.   HENT: Negative.    Respiratory: Negative.  Negative for cough and shortness of breath.   Cardiovascular: Negative.    Physical Exam  BP 122/74 (BP Location: Right Arm, Patient Position: Sitting, Cuff Size: Normal)   Pulse 74   Temp 97.6 F (36.4 C) (Axillary)   SpO2 98%  Physical Exam Constitutional:      General: He is not in acute distress.    Appearance: Normal appearance. He is not ill-appearing.  HENT:     Mouth/Throat:     Mouth: Mucous membranes are moist.  Cardiovascular:     Rate and Rhythm: Normal rate and regular rhythm.  Pulmonary:     Effort: Pulmonary effort is normal.     Breath sounds: No wheezing.  Musculoskeletal:     Comments: In motorized WC  Skin:    General: Skin is warm and dry.  Neurological:     Mental Status: He is alert. Mental status is at baseline.     Comments: Hx severe mental retardation   Psychiatric:        Mood and Affect: Mood normal.      Lab Results:  CBC No results found for: "WBC", "RBC", "HGB", "HCT", "PLT", "MCV", "MCH", "MCHC", "RDW", "LYMPHSABS", "MONOABS", "EOSABS", "BASOSABS"  BMET No  results found for: "NA", "K", "CL", "CO2", "GLUCOSE", "BUN", "CREATININE", "CALCIUM", "GFRNONAA", "GFRAA"  BNP No results found for: "BNP"  ProBNP No results found for: "PROBNP"  Imaging: No results found.   Assessment & Plan:   OBSTRUCTIVE SLEEP APNEA - Hx severe OSA, sleep study was done in December 2023, AHI 50.2 an hour with SpO2 low 59% (average 85%). Patient is intolerant to CPAP, he appears interested in  possible surgical options for treatment of sleep apnea/Inspire device. Unclear if he would be a candidate but I think its reasonable to have an evaluation   Sleep-related hypoventilation - Intolerant to CPAP but will wear oxygen  - Continue 3-4L supplemental oxygen at bedtime     Martyn Ehrich, NP 01/04/2023

## 2023-01-04 NOTE — Progress Notes (Signed)
Reviewed and agree with assessment/plan.   Chesley Mires, MD [facility] Pulmonary/Critical Care 01/04/2023, 10:24 AM Pager:  [phone]

## 2023-01-04 NOTE — Assessment & Plan Note (Signed)
-   Hx severe OSA, sleep study was done in December 2023, AHI 50.2 an hour with SpO2 low 59% (average 85%). Patient is intolerant to CPAP, he appears interested in possible surgical options for treatment of sleep apnea/Inspire device. Unclear if he would be a candidate but I think its reasonable to have an evaluation

## 2023-01-04 NOTE — Assessment & Plan Note (Signed)
-   Intolerant to CPAP but will wear oxygen  - Continue 3-4L supplemental oxygen at bedtime

## 2023-04-14 ENCOUNTER — Ambulatory Visit (INDEPENDENT_AMBULATORY_CARE_PROVIDER_SITE_OTHER): Payer: Medicare Other | Admitting: Pulmonary Disease

## 2023-04-14 ENCOUNTER — Encounter (HOSPITAL_BASED_OUTPATIENT_CLINIC_OR_DEPARTMENT_OTHER): Payer: Self-pay | Admitting: Pulmonary Disease

## 2023-04-14 VITALS — BP 118/76 | HR 82 | Temp 98.1°F

## 2023-04-14 DIAGNOSIS — G4733 Obstructive sleep apnea (adult) (pediatric): Secondary | ICD-10-CM

## 2023-04-14 NOTE — Patient Instructions (Signed)
Continue 2 liters oxygen at night  Will arrange for referral to oral surgeon to see if there are surgical options to improve sleep apnea  Follow up in 6 months

## 2023-04-14 NOTE — Progress Notes (Signed)
[facility]  Chief Complaint  Patient presents with   Follow-up    Follow up.     Constitutional:  BP 118/76 (BP Location: Right Arm, Patient Position: Sitting, Cuff Size: Normal)   Pulse 82   Temp 98.1 F (36.7 C) (Oral)   SpO2 97%   Past Medical History:  Severe mental retardation, Seizures, Scoliosis, Glaucoma, Cerebral palsy, COVID April 2023  Past Surgical History:  Richard Oliver  has no past surgical history on file.  Brief Summary:  Richard Oliver is a 57 y.o. male with obstructive sleep apnea and obesity hypoventilation syndrome.      Subjective:   Richard Oliver is here with his aide.  Richard Oliver resides at [facility].  Richard Oliver has been using oxygen at night.  Not able to tolerate CPAP.  Gets sleepy during the day.  Had referral to ENT at last visit, but they never heard about appointment getting scheduled.  Physical Exam:   Appearance - in wheelchair  ENMT - no sinus tenderness, no oral exudate, no LAN, Mallampati 3 airway, no stridor, garbled speech  Respiratory - equal breath sounds bilaterally, no wheezing or rales  CV - s1s2 regular rate and rhythm, no murmurs  Ext - no clubbing, no edema  Skin - no rashes  Psych - intermittently agitated   Sleep Tests:  PSG 12/28/07 >> AHI 35  CPAP 05/17/13 to 08/14/13 >> Used on 63 of 90 nights with average 2 hrs 42 min. Avearge AHI 0.7 with CPAP 17 cm H2O. ONO with RA 02/12/21 >> test time 9 hrs 50 min.  Baseline SpO2 92%, low SpO2 64%.  Spent 1 hr 30 min with SpO2 < 88%. HST 09/29/22 >> AHI 50.2, SpO2 low 59%   Social History:  Richard Oliver  reports that Richard Oliver has never smoked. Richard Oliver has never used smokeless tobacco. Richard Oliver reports that Richard Oliver does not drink alcohol.  Family History:  His family history is not on file.     Assessment/Plan:   Sleep disordered breathing with sleep related hypoxia/hypoventilation. - continue 2 liters oxygen at night - will arrange for referral to oral surgeon to determine if Richard Oliver is a  candidate for surgical intervention to treat obstructive sleep apnea  Time Spent Involved in Patient Care on Day of Examination:  26 minutes  Follow up:   Patient Instructions  Continue 2 liters oxygen at night  Will arrange for referral to oral surgeon to see if there are surgical options to improve sleep apnea  Follow up in 6 months  Medication List:   Allergies as of 04/14/2023   No Known Allergies      Medication List        Accurate as of April 14, 2023 10:11 AM. If you have any questions, ask your nurse or doctor.          benazepril 10 MG tablet Commonly known as: LOTENSIN Take 10 mg by mouth daily.   bisacodyl 10 MG suppository Commonly known as: DULCOLAX Place 10 mg rectally as needed.   chlorhexidine 0.12 % solution Commonly known as: PERIDEX as directed.   CHLOROPHYLL PO Take 3 mg by mouth 2 (two) times daily.   LACRI-LUBE OP Apply to eye at bedtime. 1/4 inch strip to lower lid of both eyes at bedtime for eye care   latanoprost 0.005 % ophthalmic solution Commonly known as: XALATAN as directed.   Linzess 145 MCG Caps capsule Generic drug: linaclotide Take 145 mcg by mouth daily.  loperamide 2 MG tablet Commonly known as: IMODIUM A-D Take 2 mg by mouth 4 (four) times daily as needed for diarrhea or loose stools.   ondansetron 4 MG disintegrating tablet Commonly known as: Zofran ODT Take 1 tablet (4 mg total) by mouth every 8 (eight) hours as needed for nausea or vomiting.   PHENobarbital 97.2 MG tablet Commonly known as: LUMINAL Take 97.2 mg by mouth 2 (two) times daily.   promethazine 25 MG tablet Commonly known as: PHENERGAN Take 25 mg by mouth every 6 (six) hours as needed for nausea or vomiting.   promethazine 25 MG suppository Commonly known as: PHENERGAN Place 25 mg rectally every 6 (six) hours as needed for nausea or vomiting.   senna 8.6 MG tablet Commonly known as: SENOKOT Take 1 tablet by mouth daily.   Simbrinza  1-0.2 % Susp Generic drug: Brinzolamide-Brimonidine Apply to eye. Instill 1 drop in right eye three times a day for glaucoma   timolol 0.5 % ophthalmic solution Commonly known as: BETIMOL 1 drop right eye every morning   triazolam 0.25 MG tablet Commonly known as: HALCION Take 0.25 mg by mouth at bedtime as needed (PRIOR TO DENTAL PROCEDURES).   Trulance 3 MG Tabs Generic drug: Plecanatide Take 1 tablet by mouth daily.   Vitamin D3 50 MCG (2000 UT) capsule Take 4,000 Units by mouth daily.        Signature:  Coralyn Helling, MD [facility] Pulmonary/Critical Care Pager - [phone] 04/14/2023, 10:11 AM

## 2023-05-20 ENCOUNTER — Other Ambulatory Visit: Payer: Self-pay | Admitting: Otolaryngology

## 2023-06-01 ENCOUNTER — Encounter (HOSPITAL_COMMUNITY): Payer: Self-pay | Admitting: Otolaryngology

## 2023-06-01 ENCOUNTER — Other Ambulatory Visit: Payer: Self-pay

## 2023-06-01 NOTE — Progress Notes (Signed)
SDW call  Spoke to Greta Doom, RN at [facility], [phone].  He was given pre-op instructions over the phone, and verbalized understanding of instructions provided. The facility will make patient's father aware of time of surgery and he will meet them here.     PCP - Dr. Katina Dung Cardiologist - denies Pulmonary: denies   PPM/ICD - denies Device Orders - n/a Rep Notified - n/a   Chest x-ray - 07/18/2021 EKG -  n/a Stress Test - ECHO -  Cardiac Cath -   Sleep Study/sleep apnea/CPAP: positive for sleep apnea, does not wear a CPAP  Non-diabetic  Blood Thinner Instructions: denies Aspirin Instructions:denies   ERAS Protcol - Yes, clear fluids until 1050   COVID TEST- n/a    Anesthesia review: Yes. Quadraplegic, cerebral palsy, seizure disorder, severe MR, sleep related hypoventilation   Patient denies shortness of breath, fever, cough and chest pain over the phone call  Your procedure is scheduled on Wednesday June 02, 2023  Report to  [facility] Main Entrance "A" at    A.M., then check in with the Admitting office.  Call this number if you have problems the morning of surgery:  [phone]   If you have any questions prior to your surgery date call [phone]: Open Monday-Friday 8am-4pm If you experience any cold or flu symptoms such as cough, fever, chills, shortness of breath, etc. between now and your scheduled surgery, please notify us at the above number    Remember:  Do not eat after midnight the night before your surgery  You may drink clear liquids until  1050  the morning of your surgery.   Clear liquids allowed are: Water, Non-Citrus Juices (without pulp), Carbonated Beverages, Clear Tea, Black Coffee ONLY (NO MILK, CREAM OR POWDERED CREAMER of any kind), and Gatorade   Take these medicines the morning of surgery with A SIP OF WATER:  Simbrinza eye drops, phenobarbital, timolol  As needed: Tylenol, benadryl, zofran, phenergan  As of today,  STOP taking any Aspirin (unless otherwise instructed by your surgeon) Aleve, Naproxen, Ibuprofen, Motrin, Advil, Goody's, BC's, all herbal medications, fish oil, and all vitamins.

## 2023-06-01 NOTE — Progress Notes (Signed)
Spoke with Alona Bene at [facility]- group home, pt will arrive tom at 1315. NPO post mn, and will stop clear liquids at 1245.

## 2023-06-02 ENCOUNTER — Ambulatory Visit (HOSPITAL_BASED_OUTPATIENT_CLINIC_OR_DEPARTMENT_OTHER): Payer: Medicare Other | Admitting: Physician Assistant

## 2023-06-02 ENCOUNTER — Encounter (HOSPITAL_COMMUNITY)
Admission: RE | Disposition: A | Discharge status: Home / Self Care | Payer: Self-pay | Source: Home / Self Care | Attending: Otolaryngology

## 2023-06-02 ENCOUNTER — Ambulatory Visit (HOSPITAL_COMMUNITY): Payer: Self-pay | Admitting: Physician Assistant

## 2023-06-02 ENCOUNTER — Ambulatory Visit (HOSPITAL_COMMUNITY)
Admission: RE | Admit: 2023-06-02 | Discharge: 2023-06-02 | Disposition: A | Discharge status: Home / Self Care | Payer: Medicare Other | Attending: Otolaryngology | Admitting: Otolaryngology

## 2023-06-02 DIAGNOSIS — G4733 Obstructive sleep apnea (adult) (pediatric): Secondary | ICD-10-CM | POA: Diagnosis present

## 2023-06-02 DIAGNOSIS — I10 Essential (primary) hypertension: Secondary | ICD-10-CM

## 2023-06-02 HISTORY — PX: DRUG INDUCED ENDOSCOPY: SHX6808

## 2023-06-02 LAB — BASIC METABOLIC PANEL
Anion gap: 10 (ref 5–15)
BUN: 10 mg/dL (ref 6–20)
CO2: 24 mmol/L (ref 22–32)
Calcium: 8.8 mg/dL — ABNORMAL LOW (ref 8.9–10.3)
Chloride: 101 mmol/L (ref 98–111)
Creatinine, Ser: 0.62 mg/dL (ref 0.61–1.24)
GFR, Estimated: >60 mL/min (ref >60)
Glucose, Bld: 87 mg/dL (ref 70–99)
Potassium: 4.2 mmol/L (ref 3.5–5.1)
Sodium: 135 mmol/L (ref 135–145)

## 2023-06-02 LAB — CBC
HCT: 45.2 % (ref 39.0–52.0)
Hemoglobin: 15.9 g/dL (ref 13.0–17.0)
MCH: 31.9 pg (ref 26.0–34.0)
MCHC: 35.2 g/dL (ref 30.0–36.0)
MCV: 90.6 fL (ref 80.0–100.0)
Platelets: 227 10*3/uL (ref 150–400)
RBC: 4.99 MIL/uL (ref 4.22–5.81)
RDW: 12.1 % (ref 11.5–15.5)
WBC: 4.4 10*3/uL (ref 4.0–10.5)
nRBC: 0 % (ref 0.0–0.2)

## 2023-06-02 SURGERY — DRUG INDUCED SLEEP ENDOSCOPY
Anesthesia: Monitor Anesthesia Care | Laterality: Bilateral

## 2023-06-02 MED ORDER — PROPOFOL 10 MG/ML IV BOLUS
INTRAVENOUS | Status: DC | PRN
Start: 2023-06-02 — End: 2023-06-02
  Administered 2023-06-02: 10 mg via INTRAVENOUS

## 2023-06-02 MED ORDER — PROPOFOL 10 MG/ML IV BOLUS
INTRAVENOUS | Status: AC
  Filled 2023-06-02: qty 20

## 2023-06-02 MED ORDER — HYDRALAZINE HCL 20 MG/ML IJ SOLN
INTRAMUSCULAR | Status: AC
  Filled 2023-06-02: qty 1

## 2023-06-02 MED ORDER — ORAL CARE MOUTH RINSE: 15.0000 mL | Freq: Once | OROMUCOSAL | Status: AC

## 2023-06-02 MED ORDER — CHLORHEXIDINE GLUCONATE 0.12 % MT SOLN
OROMUCOSAL | Status: AC
  Administered 2023-06-02: 15 mL via OROMUCOSAL
  Filled 2023-06-02: qty 15

## 2023-06-02 MED ORDER — CHLORHEXIDINE GLUCONATE 0.12 % MT SOLN: 15.0000 mL | Freq: Once | OROMUCOSAL | Status: AC

## 2023-06-02 MED ORDER — PROPOFOL 500 MG/50ML IV EMUL
INTRAVENOUS | Status: DC | PRN
  Administered 2023-06-02: 100 ug/kg/min via INTRAVENOUS

## 2023-06-02 MED ORDER — LACTATED RINGERS IV SOLN: INTRAVENOUS | Status: DC

## 2023-06-02 MED ORDER — HYDRALAZINE HCL 20 MG/ML IJ SOLN
10.0000 mg | Freq: Once | INTRAMUSCULAR | Status: AC
  Administered 2023-06-02: 10 mg via INTRAVENOUS

## 2023-06-02 MED ORDER — OXYMETAZOLINE HCL 0.05 % NA SOLN
NASAL | Status: AC
  Filled 2023-06-02: qty 30

## 2023-06-02 MED ORDER — OXYMETAZOLINE HCL 0.05 % NA SOLN
NASAL | Status: DC | PRN
  Administered 2023-06-02: 1

## 2023-06-02 SURGICAL SUPPLY — 13 items
BAG COUNTER SPONGE SURGICOUNT (BAG) IMPLANT
BAG SPNG CNTER NS LX DISP (BAG)
CANISTER SUCT 1200ML W/VALVE (MISCELLANEOUS) ×1 IMPLANT
GLOVE BIO SURGEON STRL SZ7.5 (GLOVE) ×1 IMPLANT
KIT BASIN OR (CUSTOM PROCEDURE TRAY) ×1 IMPLANT
NDL PRECISIONGLIDE 27X1.5 (NEEDLE) IMPLANT
NEEDLE PRECISIONGLIDE 27X1.5 (NEEDLE)
PATTIES SURGICAL .5 X3 (DISPOSABLE) ×1 IMPLANT
SHEET MEDIUM DRAPE 40X70 STRL (DRAPES) IMPLANT
SOL ANTI FOG 6CC (MISCELLANEOUS) ×1 IMPLANT
SYR CONTROL 10ML LL (SYRINGE) IMPLANT
TOWEL GREEN STERILE FF (TOWEL DISPOSABLE) ×1 IMPLANT
TUBE CONNECTING 20X1/4 (TUBING) ×1 IMPLANT

## 2023-06-02 NOTE — Anesthesia Preprocedure Evaluation (Addendum)
Anesthesia Evaluation  Patient identified by MRN, date of birth, ID band Patient confused    Reviewed: Allergy & Precautions, NPO status , Patient's Chart, lab work & pertinent test results  History of Anesthesia Complications Negative for: history of anesthetic complications  Airway Mallampati: Unable to assess       Dental  (+) Dental Advisory Given   Pulmonary sleep apnea    Pulmonary exam normal        Cardiovascular hypertension, Pt. on medications Normal cardiovascular exam     Neuro/Psych Seizures -,   Severe mental retardation CP Quadriplegia   negative psych ROS   GI/Hepatic negative GI ROS, Neg liver ROS,,,  Endo/Other  negative endocrine ROS    Renal/GU negative Renal ROS     Musculoskeletal  Scoliosis    Abdominal   Peds  Hematology negative hematology ROS (+)   Anesthesia Other Findings   Reproductive/Obstetrics                             Anesthesia Physical Anesthesia Plan  ASA: 4  Anesthesia Plan: MAC   Post-op Pain Management:    Induction:   PONV Risk Score and Plan: 1 and Propofol infusion and Treatment may vary due to age or medical condition  Airway Management Planned: Nasal Cannula and Natural Airway  Additional Equipment: None  Intra-op Plan:   Post-operative Plan:   Informed Consent: I have reviewed the patients History and Physical, chart, labs and discussed the procedure including the risks, benefits and alternatives for the proposed anesthesia with the patient or authorized representative who has indicated his/her understanding and acceptance.     Consent reviewed with POA  Plan Discussed with: CRNA and Anesthesiologist  Anesthesia Plan Comments:         Anesthesia Quick Evaluation

## 2023-06-02 NOTE — Op Note (Signed)
Preop diagnosis: Obstructive sleep apnea Postop diagnosis: same Procedure: Drug-induced sleep endoscopy Surgeon: Jenne Pane Anesth: IV sedation Compl: None Findings: There is majority anterior-posterior collapse at the velum making him a candidate for hypoglossal nerve stimulator placement.  There was also marked anterior-posterior collapse at the tongue base. Description:  After discussing risks, benefits, and alternatives, the patient was brought to the operative suite and placed on the operative table in the supine position.  Anesthesia was induced and the patient was given light sedation to simulate natural sleep. When the proper level was reached, an Afrin-soaked pledget was placed in the right nasal passage for a couple of minutes and then removed.  The fiberoptic laryngoscope was then passed to view the pharynx and larynx.  Findings are noted above and the exam was recorded.  After completion, the scope was removed and the patient was returned to anesthesia for wakeup and was moved to the recovery room in stable condition.

## 2023-06-02 NOTE — H&P (Signed)
Richard Oliver is an 57 y.o. male.   Chief Complaint: Sleep apnea HPI: 57 year old male with obstructive sleep apnea who has been unable to tolerate CPAP.  Past Medical History:  Diagnosis Date   Anisometropia    Cerebral palsy (HCC)    Glaucoma    Myopia    OSA (obstructive sleep apnea)    Quadriplegia (HCC)    Scoliosis    Seizure disorder (HCC)    Severe mental retardation    Sleep-related hypoventilation 07/08/2009    History reviewed. No pertinent surgical history.  History reviewed. No pertinent family history. Social History:  reports that he has never smoked. He has never used smokeless tobacco. He reports that he does not drink alcohol and does not use drugs.  Allergies: No Known Allergies  Medications Prior to Admission  Medication Sig Dispense Refill   acetaminophen (TYLENOL) 160 MG/5ML solution Take 20 mLs by mouth every 4 (four) hours as needed for mild pain, moderate pain or fever (Greater than 100 oral).     Artificial Tear Ointment (LACRI-LUBE OP) Apply 1 application  to eye at bedtime. Refresh 1/4 inch strip to lower lid of both eyes at bedtime for eye care     benazepril (LOTENSIN) 10 MG tablet Take 10 mg by mouth at bedtime.     Brinzolamide-Brimonidine (SIMBRINZA) 1-0.2 % SUSP Place 1 drop into the right eye 3 (three) times daily. for glaucoma     chlorhexidine (PERIDEX) 0.12 % solution Use as directed 5 mLs in the mouth or throat every evening. For 1 min     Cholecalciferol (VITAMIN D3) 2000 units capsule Take 4,000 Units by mouth daily.     ketoconazole (NIZORAL) 2 % cream Apply 1 Application topically every morning. Apply 1 fingertip amount to each foot     latanoprost (XALATAN) 0.005 % ophthalmic solution Place 1 drop into both eyes at bedtime.     LINZESS 145 MCG CAPS capsule Take 145 mcg by mouth daily. On empty stomach 3 minutes prior to meal     PHENobarbital (LUMINAL) 97.2 MG tablet Take 97.2 mg by mouth 2 (two) times daily.     senna (SENOKOT) 8.6 MG  tablet Take 2 tablets by mouth at bedtime.     timolol (BETIMOL) 0.5 % ophthalmic solution Place 1 drop into both eyes every morning.     triazolam (HALCION) 0.25 MG tablet Take 0.25 mg by mouth at bedtime as needed (PRIOR TO DENTAL PROCEDURES).     alum & mag hydroxide-simeth (MAALOX/MYLANTA) 200-200-20 MG/5ML suspension Take 15 mLs by mouth every 2 (two) hours as needed for indigestion or heartburn.     bisacodyl (DULCOLAX) 10 MG suppository Place 10 mg rectally daily as needed for mild constipation, moderate constipation or severe constipation.     diphenhydrAMINE (BENADRYL) 12.5 MG/5ML elixir Take 25 mg by mouth 4 (four) times daily as needed for allergies (Nasal Congestion).     Docosanol 10 % CREA Apply 1 application  topically daily as needed (Apply to affected area of lips).     guaiFENesin (ROBITUSSIN) 100 MG/5ML liquid Take 15 mLs by mouth every 4 (four) hours as needed for cough.     hydrocortisone cream 1 % Apply 1 Application topically as needed for itching (Bug bites).     loperamide (IMODIUM A-D) 2 MG tablet Take 2 mg by mouth 4 (four) times daily as needed for diarrhea or loose stools.     magnesium hydroxide (MILK OF MAGNESIA) 400 MG/5ML suspension Take 30  mLs by mouth daily as needed for mild constipation.     Neomycin-Bacitracin-Polymyxin (TRIPLE ANTIBIOTIC) OINT Apply 1 Application topically 2 (two) times daily as needed (x 3 days for abrasions and Superficial lacerations may cover with band-aid/ dressing if needed).     ondansetron (ZOFRAN ODT) 4 MG disintegrating tablet Take 1 tablet (4 mg total) by mouth every 8 (eight) hours as needed for nausea or vomiting. 20 tablet 0   promethazine (PHENERGAN) 25 MG suppository Place 25 mg rectally every 6 (six) hours as needed for nausea or vomiting.     promethazine (PHENERGAN) 25 MG tablet Take 25 mg by mouth every 6 (six) hours as needed for nausea or vomiting.     Sodium Phosphates (ENEMA READY-TO-USE RE) Place 1 Application rectally  daily as needed (Constipation). Step 3 : Insert enema per rectum of no results after step 1 and 2     Vitamins A & D (VITAMIN A & D) ointment Apply 1 Application topically 3 (three) times daily as needed Evette Cristal irritation/ rash/ Chapped skin). Apply to affected area(s)      No results found for this or any previous visit (from the past 48 hour(s)). No results found.  Review of Systems  All other systems reviewed and are negative.   Blood pressure (!) 148/75, pulse 68, temperature 98.2 F (36.8 C), temperature source Oral, resp. rate 20, SpO2 97%. Physical Exam Constitutional:      Appearance: Normal appearance. He is normal weight.  HENT:     Head: Normocephalic and atraumatic.     Right Ear: External ear normal.     Left Ear: External ear normal.     Nose: Nose normal.     Mouth/Throat:     Mouth: Mucous membranes are moist.     Pharynx: Oropharynx is clear.  Eyes:     Extraocular Movements: Extraocular movements intact.     Conjunctiva/sclera: Conjunctivae normal.     Pupils: Pupils are equal, round, and reactive to light.  Cardiovascular:     Rate and Rhythm: Normal rate.  Pulmonary:     Effort: Pulmonary effort is normal.  Musculoskeletal:     Cervical back: Normal range of motion.  Skin:    General: Skin is warm and dry.  Neurological:     General: No focal deficit present.     Mental Status: He is alert.  Psychiatric:        Mood and Affect: Mood normal.        Behavior: Behavior normal.      Assessment/Plan Obstructive sleep apnea and BMI 29.18.  To OR for sleep endoscopy.  Christia Reading, MD 06/02/2023, 1:14 PM

## 2023-06-02 NOTE — Transfer of Care (Signed)
Immediate Anesthesia Transfer of Care Note  Patient: Richard Oliver  Procedure(s) Performed: DRUG INDUCED SLEEP ENDOSCOPY (Bilateral)  Patient Location: PACU  Anesthesia Type:MAC  Level of Consciousness: awake and alert   Airway & Oxygen Therapy: Patient Spontanous Breathing  Post-op Assessment: Report given to RN and Post -op Vital signs reviewed and stable  Post vital signs: Reviewed and stable  Last Vitals:  Vitals Value Taken Time  BP 131/87 06/02/23 1437  Temp    Pulse 78 06/02/23 1439  Resp 13 06/02/23 1439  SpO2 99 % 06/02/23 1439  Vitals shown include unfiled device data.  Last Pain:  Vitals:   06/02/23 1201  TempSrc: Oral         Complications: No notable events documented.

## 2023-06-02 NOTE — Brief Op Note (Signed)
06/02/2023  2:34 PM  PATIENT:  Richard Oliver  57 y.o. male  PRE-OPERATIVE DIAGNOSIS:  Primary Diagnosis Obstructive sleep apnea  POST-OPERATIVE DIAGNOSIS:  same  PROCEDURE:  Procedure(s): DRUG INDUCED SLEEP ENDOSCOPY (Bilateral)  SURGEON:  Surgeons and Role:    Christia Reading, MD - Primary  PHYSICIAN ASSISTANT:   ASSISTANTS: none   ANESTHESIA:   IV sedation  EBL:  0 mL   BLOOD ADMINISTERED:none  DRAINS: none   LOCAL MEDICATIONS USED:  NONE  SPECIMEN:  No Specimen  DISPOSITION OF SPECIMEN:  N/A  COUNTS:  YES  TOURNIQUET:  * No tourniquets in log *  DICTATION: .Note written in EPIC  PLAN OF CARE: Discharge to home after PACU  PATIENT DISPOSITION:  PACU - hemodynamically stable.   Delay start of Pharmacological VTE agent (>24hrs) due to surgical blood loss or risk of bleeding: no

## 2023-06-02 NOTE — Anesthesia Procedure Notes (Signed)
Procedure Name: MAC Date/Time: 06/02/2023 2:30 PM  Performed by: Alease Medina, CRNAPre-anesthesia Checklist: Patient identified, Emergency Drugs available, Suction available and Patient being monitored Patient Re-evaluated:Patient Re-evaluated prior to induction Oxygen Delivery Method: Nasal cannula

## 2023-06-02 NOTE — Anesthesia Postprocedure Evaluation (Signed)
Anesthesia Post Note  Patient: Richard Oliver  Procedure(s) Performed: DRUG INDUCED SLEEP ENDOSCOPY (Bilateral)     Patient location during evaluation: PACU Anesthesia Type: MAC Level of consciousness: confused Pain management: pain level controlled Vital Signs Assessment: post-procedure vital signs reviewed and stable Respiratory status: spontaneous breathing, nonlabored ventilation and respiratory function stable Cardiovascular status: stable and blood pressure returned to baseline Anesthetic complications: no   No notable events documented.  Last Vitals:  Vitals:   06/02/23 1201 06/02/23 1440  BP: (!) 148/75   Pulse: 68   Resp: 20   Temp: 36.8 C 36.9 C  SpO2: 97%     Last Pain:  Vitals:   06/02/23 1201  TempSrc: Oral                 Beryle Lathe

## 2023-06-02 NOTE — Progress Notes (Addendum)
Tried calling parents to obtain consent at : [phone]  Left messages on the [phone] numbers.  No answer on the other two numbers.     1302 Father called back and consent obtained.  Father available at 16 [phone]

## 2023-06-03 ENCOUNTER — Encounter (HOSPITAL_COMMUNITY): Payer: Self-pay | Admitting: Otolaryngology

## 2023-08-25 ENCOUNTER — Other Ambulatory Visit: Payer: Self-pay | Admitting: Otolaryngology

## 2023-08-30 NOTE — Progress Notes (Incomplete)
PCP - Lucretia Field, MD  Cardiologist - ***  PPM/ICD - *** Device Orders - *** Rep Notified - ***  Chest x-ray - 07-18-21 EKG - *** Stress Test - *** ECHO - *** Cardiac Cath - ***  CPAP - *** Sleep test- 09-29-22  DM -   Blood Thinner Instructions: *** Aspirin Instructions: ***  ERAS Protcol - clear liquids until 7:30  COVID TEST- ***  Anesthesia review: ***  Patient verbally denies any shortness of breath, fever, cough and chest pain during phone call   -------------  SDW INSTRUCTIONS given:  Your procedure is scheduled on September 01, 2023.  Report to [facility] Main Entrance "A" at 8:30 A.M., and check in at the Admitting office.  Call this number if you have problems the morning of surgery:  [phone]   Remember:  Do not eat after midnight the night before your surgery  You may drink clear liquids until 7:30 the morning of your surgery.   Clear liquids allowed are: Water, Non-Citrus Juices (without pulp), Carbonated Beverages, Clear Tea, Black Coffee Only, and Gatorade    Take these medicines the morning of surgery with A SIP OF WATER  Brinzolamide-Brimonidine (SIMBRINZA)  LINZESS 145 MCG  PHENobarbital (LUMINAL)  timolol (BETIMOL) ophthalmic solution    IF NEEDED acetaminophen (TYLENOL)  (MAALOX/MYLANTA)  diphenhydrAMINE (BENADRYL)  guaiFENesin (ROBITUSSIN)    As of today, STOP taking any Aspirin (unless otherwise instructed by your surgeon) Aleve, Naproxen, Ibuprofen, Motrin, Advil, Goody's, BC's, all herbal medications, fish oil, and all vitamins.                      Do not wear jewelry, make up, or nail polish            Do not wear lotions, powders, perfumes/colognes, or deodorant.            Do not shave 48 hours prior to surgery.  Men may shave face and neck.            Do not bring valuables to the hospital.            [facility] is not responsible for any belongings or valuables.  Do NOT Smoke (Tobacco/Vaping) 24 hours prior to your  procedure If you use a CPAP at night, you may bring all equipment for your overnight stay.   Contacts, glasses, dentures or bridgework may not be worn into surgery.      For patients admitted to the hospital, discharge time will be determined by your treatment team.   Patients discharged the day of surgery will not be allowed to drive home, and someone needs to stay with them for 24 hours.    Special instructions:   [place]- Preparing For Surgery  Before surgery, you can play an important role. Because skin is not sterile, your skin needs to be as free of germs as possible. You can reduce the number of germs on your skin by washing with CHG (chlorahexidine gluconate) Soap before surgery.  CHG is an antiseptic cleaner which kills germs and bonds with the skin to continue killing germs even after washing.    Oral Hygiene is also important to reduce your risk of infection.  Remember - BRUSH YOUR TEETH THE MORNING OF SURGERY WITH YOUR REGULAR TOOTHPASTE  Please do not use if you have an allergy to CHG or antibacterial soaps. If your skin becomes reddened/irritated stop using the CHG.  Do not shave (including legs and underarms)  for at least 48 hours prior to first CHG shower. It is OK to shave your face.  Please follow these instructions carefully.   Shower the NIGHT BEFORE SURGERY and the MORNING OF SURGERY with DIAL Soap.   Pat yourself dry with a CLEAN TOWEL.  Wear CLEAN PAJAMAS to bed the night before surgery  Place CLEAN SHEETS on your bed the night of your first shower and DO NOT SLEEP WITH PETS.   Day of Surgery: Please shower morning of surgery  Wear Clean/Comfortable clothing the morning of surgery Do not apply any deodorants/lotions.   Remember to brush your teeth WITH YOUR REGULAR TOOTHPASTE.   Questions were answered. Patient verbalized understanding of instructions.

## 2023-09-01 ENCOUNTER — Encounter (HOSPITAL_COMMUNITY)
Admission: RE | Disposition: A | Discharge status: Home / Self Care | Payer: Self-pay | Source: Home / Self Care | Attending: Otolaryngology

## 2023-09-01 ENCOUNTER — Encounter (HOSPITAL_COMMUNITY): Payer: Self-pay | Admitting: Otolaryngology

## 2023-09-01 ENCOUNTER — Ambulatory Visit (HOSPITAL_COMMUNITY): Payer: Medicare Other

## 2023-09-01 ENCOUNTER — Other Ambulatory Visit: Payer: Self-pay

## 2023-09-01 ENCOUNTER — Ambulatory Visit (HOSPITAL_BASED_OUTPATIENT_CLINIC_OR_DEPARTMENT_OTHER): Payer: Medicare Other

## 2023-09-01 ENCOUNTER — Observation Stay (HOSPITAL_COMMUNITY): Payer: Medicare Other

## 2023-09-01 ENCOUNTER — Observation Stay (HOSPITAL_COMMUNITY)
Admission: RE | Admit: 2023-09-01 | Discharge: 2023-09-02 | Disposition: A | Discharge status: Home / Self Care | Payer: Medicare Other | Attending: Otolaryngology | Admitting: Otolaryngology

## 2023-09-01 DIAGNOSIS — Z6829 Body mass index (BMI) 29.0-29.9, adult: Secondary | ICD-10-CM | POA: Insufficient documentation

## 2023-09-01 DIAGNOSIS — G4733 Obstructive sleep apnea (adult) (pediatric): Secondary | ICD-10-CM | POA: Diagnosis not present

## 2023-09-01 DIAGNOSIS — E669 Obesity, unspecified: Secondary | ICD-10-CM | POA: Diagnosis not present

## 2023-09-01 HISTORY — PX: IMPLANTATION OF HYPOGLOSSAL NERVE STIMULATOR: SHX6827

## 2023-09-01 LAB — BASIC METABOLIC PANEL
Anion gap: 10 (ref 5–15)
BUN: 7 mg/dL (ref 6–20)
CO2: 22 mmol/L (ref 22–32)
Calcium: 9.1 mg/dL (ref 8.9–10.3)
Chloride: 105 mmol/L (ref 98–111)
Creatinine, Ser: 0.66 mg/dL (ref 0.61–1.24)
GFR, Estimated: >60 mL/min (ref >60)
Glucose, Bld: 104 mg/dL — ABNORMAL HIGH (ref 70–99)
Potassium: 3.7 mmol/L (ref 3.5–5.1)
Sodium: 137 mmol/L (ref 135–145)

## 2023-09-01 LAB — CBC
HCT: 44.6 % (ref 39.0–52.0)
Hemoglobin: 15.7 g/dL (ref 13.0–17.0)
MCH: 31.9 pg (ref 26.0–34.0)
MCHC: 35.2 g/dL (ref 30.0–36.0)
MCV: 90.7 fL (ref 80.0–100.0)
Platelets: 237 10*3/uL (ref 150–400)
RBC: 4.92 MIL/uL (ref 4.22–5.81)
RDW: 12.3 % (ref 11.5–15.5)
WBC: 5.1 10*3/uL (ref 4.0–10.5)
nRBC: 0 % (ref 0.0–0.2)

## 2023-09-01 SURGERY — INSERTION, HYPOGLOSSAL NERVE STIMULATOR
Anesthesia: General | Site: Chest | Laterality: Right

## 2023-09-01 MED ORDER — MIDAZOLAM HCL 2 MG/2ML IJ SOLN
INTRAMUSCULAR | Status: DC | PRN
  Administered 2023-09-01: 2 mg via INTRAVENOUS

## 2023-09-01 MED ORDER — FENTANYL CITRATE (PF) 100 MCG/2ML IJ SOLN
INTRAMUSCULAR | Status: AC
  Filled 2023-09-01: qty 2

## 2023-09-01 MED ORDER — FENTANYL CITRATE (PF) 100 MCG/2ML IJ SOLN
25.0000 ug | INTRAMUSCULAR | Status: DC | PRN
  Administered 2023-09-01: 25 ug via INTRAVENOUS

## 2023-09-01 MED ORDER — DIPHENHYDRAMINE HCL 12.5 MG/5ML PO ELIX: 25.0000 mg | ORAL_SOLUTION | Freq: Four times a day (QID) | ORAL | Status: DC | PRN

## 2023-09-01 MED ORDER — ACETAMINOPHEN 10 MG/ML IV SOLN
INTRAVENOUS | Status: DC | PRN
  Administered 2023-09-01: 1000 mg via INTRAVENOUS

## 2023-09-01 MED ORDER — PHENYLEPHRINE 80 MCG/ML (10ML) SYRINGE FOR IV PUSH (FOR BLOOD PRESSURE SUPPORT)
PREFILLED_SYRINGE | INTRAVENOUS | Status: DC | PRN
  Administered 2023-09-01 (×2): 160 ug via INTRAVENOUS

## 2023-09-01 MED ORDER — 0.9 % SODIUM CHLORIDE (POUR BTL) OPTIME
TOPICAL | Status: DC | PRN
  Administered 2023-09-01: 1000 mL

## 2023-09-01 MED ORDER — KETOROLAC TROMETHAMINE 30 MG/ML IJ SOLN
INTRAMUSCULAR | Status: DC | PRN
  Administered 2023-09-01: 30 mg via INTRAVENOUS

## 2023-09-01 MED ORDER — ORAL CARE MOUTH RINSE: 15.0000 mL | Freq: Once | OROMUCOSAL | Status: AC

## 2023-09-01 MED ORDER — PROPOFOL 10 MG/ML IV BOLUS
INTRAVENOUS | Status: AC
  Filled 2023-09-01: qty 20

## 2023-09-01 MED ORDER — LIDOCAINE 2% (20 MG/ML) 5 ML SYRINGE
INTRAMUSCULAR | Status: DC | PRN
  Administered 2023-09-01: 100 mg via INTRAVENOUS

## 2023-09-01 MED ORDER — GUAIFENESIN 100 MG/5ML PO LIQD: 15.0000 mL | ORAL | Status: DC | PRN

## 2023-09-01 MED ORDER — BRIMONIDINE TARTRATE 0.2 % OP SOLN
1.0000 [drp] | Freq: Three times a day (TID) | OPHTHALMIC | Status: DC
Start: 2023-09-01 — End: 2023-09-02
  Administered 2023-09-01 – 2023-09-02 (×2): 1 [drp] via OPHTHALMIC
  Filled 2023-09-01: qty 5

## 2023-09-01 MED ORDER — FENTANYL CITRATE (PF) 250 MCG/5ML IJ SOLN
INTRAMUSCULAR | Status: DC | PRN
  Administered 2023-09-01 (×2): 50 ug via INTRAVENOUS

## 2023-09-01 MED ORDER — TIMOLOL MALEATE 0.5 % OP SOLN
1.0000 [drp] | Freq: Every morning | OPHTHALMIC | Status: DC
  Administered 2023-09-01 – 2023-09-02 (×2): 1 [drp] via OPHTHALMIC
  Filled 2023-09-01: qty 5

## 2023-09-01 MED ORDER — ONDANSETRON HCL 4 MG/2ML IJ SOLN
INTRAMUSCULAR | Status: DC | PRN
  Administered 2023-09-01: 4 mg via INTRAVENOUS

## 2023-09-01 MED ORDER — TRIPLE ANTIBIOTIC EX OINT: 1.0000 | TOPICAL_OINTMENT | Freq: Two times a day (BID) | CUTANEOUS | Status: DC | PRN

## 2023-09-01 MED ORDER — LOPERAMIDE HCL 2 MG PO CAPS: 2.0000 mg | ORAL_CAPSULE | Freq: Four times a day (QID) | ORAL | Status: DC | PRN

## 2023-09-01 MED ORDER — LIDOCAINE-EPINEPHRINE 1 %-1:100000 IJ SOLN
INTRAMUSCULAR | Status: AC
  Filled 2023-09-01: qty 1

## 2023-09-01 MED ORDER — MAGNESIUM HYDROXIDE 400 MG/5ML PO SUSP: 30.0000 mL | Freq: Every day | ORAL | Status: DC | PRN

## 2023-09-01 MED ORDER — CHLORHEXIDINE GLUCONATE 0.12 % MT SOLN
15.0000 mL | Freq: Once | OROMUCOSAL | Status: AC
  Administered 2023-09-01: 15 mL via OROMUCOSAL
  Filled 2023-09-01: qty 15

## 2023-09-01 MED ORDER — SENNA 8.6 MG PO TABS
2.0000 | ORAL_TABLET | Freq: Every day | ORAL | Status: DC
  Administered 2023-09-01: 17.2 mg via ORAL
  Filled 2023-09-01: qty 2

## 2023-09-01 MED ORDER — PHENOBARBITAL 32.4 MG PO TABS
97.2000 mg | ORAL_TABLET | Freq: Two times a day (BID) | ORAL | Status: DC
Start: 2023-09-01 — End: 2023-09-01

## 2023-09-01 MED ORDER — SUCCINYLCHOLINE CHLORIDE 200 MG/10ML IV SOSY
PREFILLED_SYRINGE | INTRAVENOUS | Status: DC | PRN
  Administered 2023-09-01: 120 mg via INTRAVENOUS

## 2023-09-01 MED ORDER — FENTANYL CITRATE (PF) 250 MCG/5ML IJ SOLN
INTRAMUSCULAR | Status: AC
  Filled 2023-09-01: qty 5

## 2023-09-01 MED ORDER — OXYCODONE HCL 5 MG PO TABS: 5.0000 mg | ORAL_TABLET | Freq: Once | ORAL | Status: DC | PRN

## 2023-09-01 MED ORDER — BRINZOLAMIDE 1 % OP SUSP
1.0000 [drp] | Freq: Three times a day (TID) | OPHTHALMIC | Status: DC
  Administered 2023-09-01 – 2023-09-02 (×3): 1 [drp] via OPHTHALMIC
  Filled 2023-09-01: qty 10

## 2023-09-01 MED ORDER — ENEMA READY-TO-USE 7-19 GM/118ML RE ENEM: ENEMA | Freq: Every day | RECTAL | Status: DC | PRN

## 2023-09-01 MED ORDER — HYDROCODONE-ACETAMINOPHEN 5-325 MG PO TABS
1.0000 | ORAL_TABLET | ORAL | Status: DC | PRN
  Administered 2023-09-01 – 2023-09-02 (×3): 2 via ORAL
  Filled 2023-09-01 (×3): qty 2

## 2023-09-01 MED ORDER — LINACLOTIDE 145 MCG PO CAPS
145.0000 ug | ORAL_CAPSULE | Freq: Every day | ORAL | Status: DC
  Administered 2023-09-01 – 2023-09-02 (×2): 145 ug via ORAL
  Filled 2023-09-01 (×2): qty 1

## 2023-09-01 MED ORDER — LATANOPROST 0.005 % OP SOLN
1.0000 [drp] | Freq: Every day | OPHTHALMIC | Status: DC
  Administered 2023-09-01: 1 [drp] via OPHTHALMIC
  Filled 2023-09-01: qty 2.5

## 2023-09-01 MED ORDER — MORPHINE SULFATE (PF) 2 MG/ML IV SOLN
2.0000 mg | INTRAVENOUS | Status: DC | PRN
  Administered 2023-09-01 (×2): 4 mg via INTRAVENOUS
  Filled 2023-09-01 (×2): qty 2

## 2023-09-01 MED ORDER — MIDAZOLAM HCL 2 MG/2ML IJ SOLN
INTRAMUSCULAR | Status: AC
Start: 2023-09-01 — End: ?
  Filled 2023-09-01: qty 2

## 2023-09-01 MED ORDER — PHENOBARBITAL 32.4 MG PO TABS
97.2000 mg | ORAL_TABLET | Freq: Two times a day (BID) | ORAL | Status: DC
  Administered 2023-09-01 – 2023-09-02 (×2): 97.2 mg via ORAL
  Filled 2023-09-01 (×2): qty 3

## 2023-09-01 MED ORDER — ALUM & MAG HYDROXIDE-SIMETH 200-200-20 MG/5ML PO SUSP: 15.0000 mL | ORAL | Status: DC | PRN

## 2023-09-01 MED ORDER — LIDOCAINE-EPINEPHRINE 1 %-1:100000 IJ SOLN
INTRAMUSCULAR | Status: DC | PRN
  Administered 2023-09-01: 4.5 mL

## 2023-09-01 MED ORDER — DEXAMETHASONE SODIUM PHOSPHATE 10 MG/ML IJ SOLN
INTRAMUSCULAR | Status: DC | PRN
  Administered 2023-09-01: 10 mg via INTRAVENOUS

## 2023-09-01 MED ORDER — ACETAMINOPHEN 160 MG/5ML PO SOLN: 480.0000 mg | ORAL | Status: DC | PRN

## 2023-09-01 MED ORDER — BISACODYL 10 MG RE SUPP: 10.0000 mg | Freq: Every day | RECTAL | Status: DC | PRN

## 2023-09-01 MED ORDER — ACETAMINOPHEN 10 MG/ML IV SOLN
INTRAVENOUS | Status: AC
  Filled 2023-09-01: qty 100

## 2023-09-01 MED ORDER — VITAMIN D 25 MCG (1000 UNIT) PO TABS
4000.0000 [IU] | ORAL_TABLET | Freq: Every day | ORAL | Status: DC
Start: 2023-09-01 — End: 2023-09-02
  Administered 2023-09-01 – 2023-09-02 (×2): 4000 [IU] via ORAL
  Filled 2023-09-01 (×2): qty 4

## 2023-09-01 MED ORDER — DOCOSANOL 10 % EX CREA: 1.0000 | TOPICAL_CREAM | Freq: Every day | CUTANEOUS | Status: DC | PRN

## 2023-09-01 MED ORDER — CEFAZOLIN SODIUM-DEXTROSE 2-4 GM/100ML-% IV SOLN
2.0000 g | INTRAVENOUS | Status: AC
  Administered 2023-09-01: 2 g via INTRAVENOUS
  Filled 2023-09-01: qty 100

## 2023-09-01 MED ORDER — AMISULPRIDE (ANTIEMETIC) 5 MG/2ML IV SOLN
INTRAVENOUS | Status: AC
  Filled 2023-09-01: qty 2

## 2023-09-01 MED ORDER — LACTATED RINGERS IV SOLN: INTRAVENOUS | Status: DC

## 2023-09-01 MED ORDER — VITAMINS A & D EX OINT: 1.0000 | TOPICAL_OINTMENT | Freq: Three times a day (TID) | CUTANEOUS | Status: DC | PRN

## 2023-09-01 MED ORDER — KCL IN DEXTROSE-NACL 20-5-0.45 MEQ/L-%-% IV SOLN
INTRAVENOUS | Status: DC
  Filled 2023-09-01: qty 1000

## 2023-09-01 MED ORDER — AMISULPRIDE (ANTIEMETIC) 5 MG/2ML IV SOLN
5.0000 mg | Freq: Once | INTRAVENOUS | Status: AC
  Administered 2023-09-01: 5 mg via INTRAVENOUS

## 2023-09-01 MED ORDER — EPHEDRINE SULFATE-NACL 50-0.9 MG/10ML-% IV SOSY
PREFILLED_SYRINGE | INTRAVENOUS | Status: DC | PRN
  Administered 2023-09-01: 5 mg via INTRAVENOUS

## 2023-09-01 MED ORDER — POLYVINYL ALCOHOL 1.4 % OP SOLN: 1.0000 [drp] | OPHTHALMIC | Status: DC | PRN

## 2023-09-01 MED ORDER — ONDANSETRON HCL 4 MG/2ML IJ SOLN: 4.0000 mg | Freq: Four times a day (QID) | INTRAMUSCULAR | Status: DC | PRN

## 2023-09-01 MED ORDER — PHENYLEPHRINE HCL-NACL 20-0.9 MG/250ML-% IV SOLN
INTRAVENOUS | Status: DC | PRN
  Administered 2023-09-01: 40 ug/min via INTRAVENOUS

## 2023-09-01 MED ORDER — PHENOBARBITAL 97.2 MG PO TABS
97.2000 mg | ORAL_TABLET | Freq: Once | ORAL | Status: AC
  Administered 2023-09-01: 97.2 mg via ORAL
  Filled 2023-09-01: qty 1

## 2023-09-01 MED ORDER — SODIUM CHLORIDE 0.9 % IV SOLN: INTRAVENOUS | Status: DC | PRN

## 2023-09-01 MED ORDER — BENAZEPRIL HCL 5 MG PO TABS
10.0000 mg | ORAL_TABLET | Freq: Every day | ORAL | Status: DC
  Administered 2023-09-01: 10 mg via ORAL
  Filled 2023-09-01 (×2): qty 2

## 2023-09-01 MED ORDER — OXYCODONE HCL 5 MG/5ML PO SOLN: 5.0000 mg | Freq: Once | ORAL | Status: DC | PRN

## 2023-09-01 MED ORDER — PROPOFOL 10 MG/ML IV BOLUS
INTRAVENOUS | Status: DC | PRN
  Administered 2023-09-01: 90 mg via INTRAVENOUS

## 2023-09-01 MED ORDER — CHLORHEXIDINE GLUCONATE 0.12 % MT SOLN
5.0000 mL | Freq: Every evening | OROMUCOSAL | Status: DC
Start: 2023-09-01 — End: 2023-09-02
  Administered 2023-09-01: 5 mL via OROMUCOSAL
  Filled 2023-09-01: qty 15

## 2023-09-01 SURGICAL SUPPLY — 69 items
ACC NRSTM 4 TRQ WRNCH STRL (MISCELLANEOUS)
ADH SKN CLS APL DERMABOND .7 (GAUZE/BANDAGES/DRESSINGS) ×2
BAG COUNTER SPONGE SURGICOUNT (BAG) ×1 IMPLANT
BAG SPNG CNTER NS LX DISP (BAG) ×1
BLADE CLIPPER SURG (BLADE) IMPLANT
BLADE SURG 15 STRL LF DISP TIS (BLADE) ×3 IMPLANT
BLADE SURG 15 STRL SS (BLADE) ×1
CANISTER SUCT 3000ML PPV (MISCELLANEOUS) ×1 IMPLANT
CORD BIPOLAR FORCEPS 12FT (ELECTRODE) ×1 IMPLANT
COVER PROBE W GEL 5X96 (DRAPES) ×1 IMPLANT
COVER SURGICAL LIGHT HANDLE (MISCELLANEOUS) ×1 IMPLANT
DERMABOND ADVANCED .7 DNX12 (GAUZE/BANDAGES/DRESSINGS) ×2 IMPLANT
DRAPE C-ARM 35X43 STRL (DRAPES) ×1 IMPLANT
DRAPE HEAD BAR (DRAPES) ×1 IMPLANT
DRAPE INCISE IOBAN 66X45 STRL (DRAPES) ×1 IMPLANT
DRAPE MICROSCOPE LEICA 54X105 (DRAPES) ×1 IMPLANT
DRAPE UTILITY XL STRL (DRAPES) ×1 IMPLANT
DRSG TEGADERM 4X4.75 (GAUZE/BANDAGES/DRESSINGS) ×3 IMPLANT
ELECT COATED BLADE 2.86 ST (ELECTRODE) ×1 IMPLANT
ELECT EMG 18 NIMS (NEUROSURGERY SUPPLIES) ×1
ELECT REM PT RETURN 9FT ADLT (ELECTROSURGICAL) ×1
ELECTRODE EMG 18 NIMS (NEUROSURGERY SUPPLIES) ×1 IMPLANT
ELECTRODE REM PT RTRN 9FT ADLT (ELECTROSURGICAL) ×1 IMPLANT
FORCEPS BIPOLAR SPETZLER 8 1.0 (NEUROSURGERY SUPPLIES) ×1 IMPLANT
GAUZE 4X4 16PLY ~~LOC~~+RFID DBL (SPONGE) ×1 IMPLANT
GAUZE SPONGE 4X4 12PLY STRL (GAUZE/BANDAGES/DRESSINGS) ×1 IMPLANT
GENERATOR PULSE INSPIRE (Generator) ×1 IMPLANT
GENERATOR PULSE INSPIRE IV (Generator) ×1 IMPLANT
GLOVE BIO SURGEON STRL SZ 6.5 (GLOVE) IMPLANT
GLOVE BIO SURGEON STRL SZ7.5 (GLOVE) ×1 IMPLANT
GOWN STRL REUS W/ TWL LRG LVL3 (GOWN DISPOSABLE) ×3 IMPLANT
GOWN STRL REUS W/TWL LRG LVL3 (GOWN DISPOSABLE) ×3
KIT BASIN OR (CUSTOM PROCEDURE TRAY) ×1 IMPLANT
KIT NEURO ACCESSORY W/WRENCH (MISCELLANEOUS) IMPLANT
KIT TURNOVER KIT B (KITS) ×1 IMPLANT
LEAD SENSING RESP INSPIRE (Lead) ×1 IMPLANT
LEAD SENSING RESP INSPIRE IV (Lead) ×1 IMPLANT
LEAD SLEEP STIM INSPIRE IV/V (Lead) ×1 IMPLANT
LEAD SLEEP STIMULATION INSPIRE (Lead) ×1 IMPLANT
LOOP VASCLR MAXI BLUE 18IN ST (MISCELLANEOUS) ×1 IMPLANT
MARKER SKIN DUAL TIP RULER LAB (MISCELLANEOUS) ×2 IMPLANT
NDL HYPO 25GX1X1/2 BEV (NEEDLE) ×1 IMPLANT
NEEDLE HYPO 25GX1X1/2 BEV (NEEDLE) ×1
NS IRRIG 1000ML POUR BTL (IV SOLUTION) ×1 IMPLANT
PAD ARMBOARD 7.5X6 YLW CONV (MISCELLANEOUS) ×1 IMPLANT
PASSER CATH 38CM DISP (INSTRUMENTS) ×1 IMPLANT
PENCIL SMOKE EVACUATOR (MISCELLANEOUS) ×1 IMPLANT
POSITIONER HEAD DONUT 9IN (MISCELLANEOUS) ×1 IMPLANT
PROBE NERVE STIMULATOR (NEUROSURGERY SUPPLIES) ×1 IMPLANT
REMOTE CONTROL SLEEP INSPIRE (MISCELLANEOUS) ×1 IMPLANT
SET WALTER ACTIVATION W/DRAPE (SET/KITS/TRAYS/PACK) ×1 IMPLANT
SPONGE INTESTINAL PEANUT (DISPOSABLE) ×1 IMPLANT
STAPLER VISISTAT 35W (STAPLE) ×1 IMPLANT
SUT SILK 2 0 SH (SUTURE) ×1 IMPLANT
SUT SILK 3 0 REEL (SUTURE) ×1 IMPLANT
SUT SILK 3 0 SH 30 (SUTURE) ×2 IMPLANT
SUT SILK 3-0 (SUTURE) ×1
SUT SILK 3-0 RB1 30XBRD (SUTURE) ×1
SUT VIC AB 3-0 SH 27 (SUTURE) ×2
SUT VIC AB 3-0 SH 27X BRD (SUTURE) ×2 IMPLANT
SUT VIC AB 4-0 PS2 27 (SUTURE) ×2 IMPLANT
SUTURE SILK 3-0 RB1 30XBRD (SUTURE) ×1 IMPLANT
SYR 10ML LL (SYRINGE) ×1 IMPLANT
TAPE CLOTH SURG 4X10 WHT LF (GAUZE/BANDAGES/DRESSINGS) ×1 IMPLANT
TAPE PAPER 3X10 WHT MICROPORE (GAUZE/BANDAGES/DRESSINGS) IMPLANT
TOWEL GREEN STERILE (TOWEL DISPOSABLE) ×1 IMPLANT
TRAY ENT MC OR (CUSTOM PROCEDURE TRAY) ×1 IMPLANT
VASCULAR TIE MAXI BLUE 18IN ST (MISCELLANEOUS) ×1
VASCULAR TIE MINI RED 18IN STL (MISCELLANEOUS) ×1 IMPLANT

## 2023-09-01 NOTE — H&P (Signed)
Richard Oliver is an 57 y.o. male.   Chief Complaint: Sleep apnea HPI: 57 year old male with sleep apnea who has been unable to tolerate CPAP.  Past Medical History:  Diagnosis Date   Anisometropia    Cerebral palsy (HCC)    Glaucoma    Myopia    OSA (obstructive sleep apnea)    Quadriplegia (HCC)    Scoliosis    Seizure disorder (HCC)    Severe mental retardation    Sleep-related hypoventilation 07/08/2009    Past Surgical History:  Procedure Laterality Date   DRUG INDUCED ENDOSCOPY Bilateral 06/02/2023   Procedure: DRUG INDUCED SLEEP ENDOSCOPY;  Surgeon: Christia Reading, MD;  Location: [facility] OR;  Service: ENT;  Laterality: Bilateral;    History reviewed. No pertinent family history. Social History:  reports that he has never smoked. He has never used smokeless tobacco. He reports that he does not drink alcohol and does not use drugs.  Allergies: No Known Allergies  Medications Prior to Admission  Medication Sig Dispense Refill   acetaminophen (TYLENOL) 160 MG/5ML solution Take 15 mLs by mouth every 4 (four) hours as needed for mild pain (pain score 1-3), moderate pain (pain score 4-6) or fever (Greater than 100 oral).     Artificial Tear Ointment (LACRI-LUBE OP) Apply 1 application  to eye at bedtime. Refresh 1/4 inch strip to lower lid of both eyes at bedtime for eye care     benazepril (LOTENSIN) 10 MG tablet Take 10 mg by mouth at bedtime.     Brinzolamide-Brimonidine (SIMBRINZA) 1-0.2 % SUSP Place 1 drop into the right eye 3 (three) times daily. for glaucoma     chlorhexidine (PERIDEX) 0.12 % solution Use as directed 5 mLs in the mouth or throat every evening. For 1 min     Cholecalciferol (VITAMIN D3) 2000 units capsule Take 4,000 Units by mouth daily.     Docosanol 10 % CREA Apply 1 application  topically daily as needed (Apply to affected area of lips).     ketoconazole (NIZORAL) 2 % cream Apply 1 Application topically every morning. Apply 1 fingertip amount to each foot      latanoprost (XALATAN) 0.005 % ophthalmic solution Place 1 drop into both eyes at bedtime.     LINZESS 145 MCG CAPS capsule Take 145 mcg by mouth daily. On empty stomach 3 minutes prior to meal     Neomycin-Bacitracin-Polymyxin (TRIPLE ANTIBIOTIC) OINT Apply 1 Application topically 2 (two) times daily as needed (x 3 days for abrasions and Superficial lacerations may cover with band-aid/ dressing if needed).     PHENobarbital (LUMINAL) 97.2 MG tablet Take 97.2 mg by mouth 2 (two) times daily.     senna (SENOKOT) 8.6 MG tablet Take 2 tablets by mouth at bedtime.     timolol (BETIMOL) 0.5 % ophthalmic solution Place 1 drop into both eyes every morning.     Vitamins A & D (VITAMIN A & D) ointment Apply 1 Application topically 3 (three) times daily as needed (Skin irritation/ rash/ Chapped skin). Apply to affected area(s)     alum & mag hydroxide-simeth (MAALOX/MYLANTA) 200-200-20 MG/5ML suspension Take 15 mLs by mouth every 2 (two) hours as needed for indigestion or heartburn.     bisacodyl (DULCOLAX) 10 MG suppository Place 10 mg rectally daily as needed for mild constipation, moderate constipation or severe constipation.     diphenhydrAMINE (BENADRYL) 12.5 MG/5ML elixir Take 25 mg by mouth 4 (four) times daily as needed for allergies or itching (  Nasal Congestion).     guaiFENesin (ROBITUSSIN) 100 MG/5ML liquid Take 15 mLs by mouth every 4 (four) hours as needed for cough.     hydrocortisone cream 1 % Apply 1 Application topically as needed for itching (Bug bites).     loperamide (IMODIUM A-D) 2 MG tablet Take 2 mg by mouth 4 (four) times daily as needed for diarrhea or loose stools. Not to exceed 16 mg in 24 hours     magnesium hydroxide (MILK OF MAGNESIA) 400 MG/5ML suspension Take 30 mLs by mouth daily as needed for mild constipation.     OXYGEN Place 3 L into the nose at bedtime.     Sodium Phosphates (ENEMA READY-TO-USE RE) Place 1 Application rectally daily as needed (Constipation). Step 3 : Insert  enema per rectum of no results after step 1 and 2      Results for orders placed or performed during the hospital encounter of 09/01/23 (from the past 48 hour(s))  Basic metabolic panel per protocol     Status: Abnormal   Collection Time: 09/01/23  7:34 AM  Result Value Ref Range   Sodium 137 135 - 145 mmol/L   Potassium 3.7 3.5 - 5.1 mmol/L   Chloride 105 98 - 111 mmol/L   CO2 22 22 - 32 mmol/L   Glucose, Bld 104 (H) 70 - 99 mg/dL    Comment: Glucose reference range applies only to samples taken after fasting for at least 8 hours.   BUN 7 6 - 20 mg/dL   Creatinine, Ser 1.61 0.61 - 1.24 mg/dL   Calcium 9.1 8.9 - 09.6 mg/dL   GFR, Estimated >04 >54 mL/min    Comment: (NOTE) Calculated using the CKD-EPI Creatinine Equation (2021)    Anion gap 10 5 - 15    Comment: Performed at [facility], [address], [place], [place] [address]  CBC per protocol     Status: None   Collection Time: 09/01/23  7:34 AM  Result Value Ref Range   WBC 5.1 4.0 - 10.5 K/uL   RBC 4.92 4.22 - 5.81 MIL/uL   Hemoglobin 15.7 13.0 - 17.0 g/dL   HCT 91.4 78.2 - 95.6 %   MCV 90.7 80.0 - 100.0 fL   MCH 31.9 26.0 - 34.0 pg   MCHC 35.2 30.0 - 36.0 g/dL   RDW 21.3 08.6 - 57.8 %   Platelets 237 150 - 400 K/uL   nRBC 0.0 0.0 - 0.2 %    Comment: Performed at [facility], [address], [place], [place] [address]   No results found.  Review of Systems  All other systems reviewed and are negative.   Blood pressure (!) 157/97, pulse 75, temperature 98 F (36.7 C), resp. rate 18, height 5\' 4"  (1.626 m), SpO2 95%. Physical Exam Constitutional:      Appearance: Normal appearance.  HENT:     Head: Normocephalic and atraumatic.     Right Ear: External ear normal.     Left Ear: External ear normal.     Nose: Nose normal.     Mouth/Throat:     Mouth: Mucous membranes are moist.     Pharynx: Oropharynx is clear.  Eyes:     Extraocular Movements: Extraocular movements intact.      Conjunctiva/sclera: Conjunctivae normal.     Pupils: Pupils are equal, round, and reactive to light.  Cardiovascular:     Rate and Rhythm: Normal rate.  Pulmonary:  Effort: Pulmonary effort is normal.  Musculoskeletal:     Cervical back: Normal range of motion.  Skin:    General: Skin is warm and dry.  Neurological:     General: No focal deficit present.     Mental Status: He is alert and oriented to person, place, and time.  Psychiatric:        Mood and Affect: Mood normal.        Behavior: Behavior normal.      Assessment/Plan Obstructive sleep apnea and BMI 29.18  To OR for hypoglossal nerve stimulator placement.  Christia Reading, MD 09/01/2023, 8:23 AM

## 2023-09-01 NOTE — Plan of Care (Signed)

## 2023-09-01 NOTE — Transfer of Care (Signed)
Immediate Anesthesia Transfer of Care Note  Patient: Richard Oliver  Procedure(s) Performed: IMPLANTATION OF HYPOGLOSSAL NERVE STIMULATOR (Right: Chest)  Patient Location: PACU  Anesthesia Type:General  Level of Consciousness: sedated  Airway & Oxygen Therapy: Patient Spontanous Breathing  Post-op Assessment: Report given to RN and Post -op Vital signs reviewed and stable  Post vital signs: Reviewed and stable  Last Vitals:  Vitals Value Taken Time  BP    Temp    Pulse 86 09/01/23 1155  Resp 12 09/01/23 1155  SpO2 94 % 09/01/23 1155  Vitals shown include unfiled device data.  Last Pain: There were no vitals filed for this visit.       Complications: No notable events documented.

## 2023-09-01 NOTE — Anesthesia Preprocedure Evaluation (Signed)
Anesthesia Evaluation  Patient identified by MRN, date of birth, ID band Patient awake    Reviewed: Allergy & Precautions, H&P , NPO status , Patient's Chart, lab work & pertinent test results  Airway Mallampati: II   Neck ROM: full    Dental   Pulmonary sleep apnea    breath sounds clear to auscultation       Cardiovascular negative cardio ROS  Rhythm:regular Rate:Normal     Neuro/Psych Seizures -,  Cerebral palsy. Quadriplegia. Severe mental retardation.  Neuromuscular disease    GI/Hepatic   Endo/Other    Renal/GU      Musculoskeletal   Abdominal   Peds  Hematology   Anesthesia Other Findings   Reproductive/Obstetrics                             Anesthesia Physical Anesthesia Plan  ASA: 3  Anesthesia Plan: General   Post-op Pain Management:    Induction: Intravenous  PONV Risk Score and Plan: 2 and Ondansetron, Dexamethasone and Treatment may vary due to age or medical condition  Airway Management Planned: Oral ETT  Additional Equipment:   Intra-op Plan:   Post-operative Plan: Extubation in OR  Informed Consent: I have reviewed the patients History and Physical, chart, labs and discussed the procedure including the risks, benefits and alternatives for the proposed anesthesia with the patient or authorized representative who has indicated his/her understanding and acceptance.     Dental advisory given  Plan Discussed with: CRNA, Anesthesiologist and Surgeon  Anesthesia Plan Comments:        Anesthesia Quick Evaluation

## 2023-09-01 NOTE — Anesthesia Procedure Notes (Signed)
Procedure Name: Intubation Date/Time: 09/01/2023 9:30 AM  Performed by: Kayleen Memos, CRNAPre-anesthesia Checklist: Patient identified, Emergency Drugs available, Suction available and Patient being monitored Patient Re-evaluated:Patient Re-evaluated prior to induction Oxygen Delivery Method: Circle System Utilized Preoxygenation: Pre-oxygenation with 100% oxygen Induction Type: IV induction Ventilation: Oral airway inserted - appropriate to patient size and Mask ventilation with difficulty Laryngoscope Size: Glidescope and 3 Grade View: Grade I Tube type: Oral Number of attempts: 1 Airway Equipment and Method: Stylet and Oral airway Placement Confirmation: ETT inserted through vocal cords under direct vision, positive ETCO2 and breath sounds checked- equal and bilateral Secured at: 24 cm Tube secured with: Tape Dental Injury: Teeth and Oropharynx as per pre-operative assessment

## 2023-09-01 NOTE — Brief Op Note (Signed)
09/01/2023  11:33 AM  PATIENT:  Marla Roe  57 y.o. male  PRE-OPERATIVE DIAGNOSIS:  Obstructive sleep apnea  POST-OPERATIVE DIAGNOSIS:  Obstructive sleep apnea  PROCEDURE:  Procedure(s): IMPLANTATION OF HYPOGLOSSAL NERVE STIMULATOR (Right)  SURGEON:  Surgeons and Role:    Christia Reading, MD - Primary  PHYSICIAN ASSISTANT:   ASSISTANTS: RNFA   ANESTHESIA:   general  EBL:  Minimal   BLOOD ADMINISTERED:none  DRAINS: none   LOCAL MEDICATIONS USED:  LIDOCAINE   SPECIMEN:  No Specimen  DISPOSITION OF SPECIMEN:  N/A  COUNTS:  YES  TOURNIQUET:  * No tourniquets in log *  DICTATION: .Note written in EPIC  PLAN OF CARE: Admit for overnight observation  PATIENT DISPOSITION:  PACU - hemodynamically stable.   Delay start of Pharmacological VTE agent (>24hrs) due to surgical blood loss or risk of bleeding: no

## 2023-09-01 NOTE — Op Note (Signed)

## 2023-09-02 ENCOUNTER — Encounter (HOSPITAL_COMMUNITY): Payer: Self-pay | Admitting: Otolaryngology

## 2023-09-02 DIAGNOSIS — G4733 Obstructive sleep apnea (adult) (pediatric): Secondary | ICD-10-CM | POA: Diagnosis not present

## 2023-09-02 MED ORDER — HYDROCODONE-ACETAMINOPHEN 5-325 MG PO TABS: 1.0000 | ORAL_TABLET | Freq: Four times a day (QID) | ORAL | 0 refills | Status: AC | PRN

## 2023-09-02 NOTE — Discharge Summary (Signed)
Physician Discharge Summary  [name] MRN: [id number] DOB/AGE: September 28, 1966 57 y.o.  Admit date: 09/01/2023 Discharge date: 09/02/2023  Admission Diagnoses: Obstructive sleep apnea  Discharge Diagnoses:  Principal Problem:   OSA (obstructive sleep apnea)   Discharged Condition: good  Hospital Course: 57 year old male with special needs who has sleep apnea but has been unable to tolerate CPAP.  He presented for surgical management.  See operative note.  He was observed overnight due to his special needs and residence in a group home.  He did well overnight and dressings were removed on POD 1.  He is felt stable for discharge.  Consults: None  Significant Diagnostic Studies: None  Treatments: surgery: Hypoglossal nerve stimulator placement  Discharge Exam: Blood pressure 115/73, pulse 71, temperature 98.3 F (36.8 C), temperature source Oral, resp. rate 18, height 5\' 4"  (1.626 m), weight 77.1 kg, SpO2 99%. General appearance: alert, cooperative, and no distress Neck: dressing removed, incision clean and intact with no fluid collection Chest wall: dressing removed, incision clean and intact with no fluid collection Neurologic: Motor: no lower lip weakness  Disposition: Discharge disposition: 01-Home or Self Care       Discharge Instructions     Diet - low sodium heart healthy   Complete by: As directed    Discharge instructions   Complete by: As directed    Resume normal diet.  Avoid strenuous activity.  OK to allow incisions to get wet in shower, gently pat dry.  Do not apply ointment to the incisions.   Increase activity slowly   Complete by: As directed       Allergies as of 09/02/2023   No Known Allergies      Medication List     TAKE these medications    acetaminophen 160 MG/5ML solution Commonly known as: TYLENOL Take 15 mLs by mouth every 4 (four) hours as needed for mild pain (pain score 1-3), moderate pain (pain score 4-6) or fever  (Greater than 100 oral).   alum & mag hydroxide-simeth 200-200-20 MG/5ML suspension Commonly known as: MAALOX/MYLANTA Take 15 mLs by mouth every 2 (two) hours as needed for indigestion or heartburn.   benazepril 10 MG tablet Commonly known as: LOTENSIN Take 10 mg by mouth at bedtime.   bisacodyl 10 MG suppository Commonly known as: DULCOLAX Place 10 mg rectally daily as needed for mild constipation, moderate constipation or severe constipation.   chlorhexidine 0.12 % solution Commonly known as: PERIDEX Use as directed 5 mLs in the mouth or throat every evening. For 1 min   diphenhydrAMINE 12.5 MG/5ML elixir Commonly known as: BENADRYL Take 25 mg by mouth 4 (four) times daily as needed for allergies or itching (Nasal Congestion).   Docosanol 10 % Crea Apply 1 application  topically daily as needed (Apply to affected area of lips).   ENEMA READY-TO-USE RE Place 1 Application rectally daily as needed (Constipation). Step 3 : Insert enema per rectum of no results after step 1 and 2   guaiFENesin 100 MG/5ML liquid Commonly known as: ROBITUSSIN Take 15 mLs by mouth every 4 (four) hours as needed for cough.   HYDROcodone-acetaminophen 5-325 MG tablet Commonly known as: NORCO/VICODIN Take 1-2 tablets by mouth every 6 (six) hours as needed for severe pain (pain score 7-10).   hydrocortisone cream 1 % Apply 1 Application topically as needed for itching (Bug bites).   ketoconazole 2 % cream Commonly known as: NIZORAL Apply 1 Application topically every morning. Apply 1 fingertip amount  to each foot   LACRI-LUBE OP Apply 1 application  to eye at bedtime. Refresh 1/4 inch strip to lower lid of both eyes at bedtime for eye care   latanoprost 0.005 % ophthalmic solution Commonly known as: XALATAN Place 1 drop into both eyes at bedtime.   Linzess 145 MCG Caps capsule Generic drug: linaclotide Take 145 mcg by mouth daily. On empty stomach 3 minutes prior to meal   loperamide 2  MG tablet Commonly known as: IMODIUM A-D Take 2 mg by mouth 4 (four) times daily as needed for diarrhea or loose stools. Not to exceed 16 mg in 24 hours   Milk of Magnesia 1200 MG/15ML suspension Generic drug: magnesium hydroxide Take 30 mLs by mouth daily as needed for mild constipation.   OXYGEN Place 3 L into the nose at bedtime.   PHENobarbital 97.2 MG tablet Commonly known as: LUMINAL Take 97.2 mg by mouth 2 (two) times daily.   senna 8.6 MG tablet Commonly known as: SENOKOT Take 2 tablets by mouth at bedtime.   Simbrinza 1-0.2 % Susp Generic drug: Brinzolamide-Brimonidine Place 1 drop into the right eye 3 (three) times daily. for glaucoma   timolol 0.5 % ophthalmic solution Commonly known as: BETIMOL Place 1 drop into both eyes every morning.   Triple Antibiotic Oint Apply 1 Application topically 2 (two) times daily as needed (x 3 days for abrasions and Superficial lacerations may cover with band-aid/ dressing if needed).   vitamin A & D ointment Apply 1 Application topically 3 (three) times daily as needed (Skin irritation/ rash/ Chapped skin). Apply to affected area(s)   Vitamin D3 50 MCG (2000 UT) capsule Take 4,000 Units by mouth daily.        Follow-up Information     Christia Reading, MD. Schedule an appointment as soon as possible for a visit in 1 week(s).   Specialty: Otolaryngology Contact information: [address] [place] [address] [phone]                 Signed: Christia Reading 09/02/2023, 7:53 AM

## 2023-09-02 NOTE — Anesthesia Postprocedure Evaluation (Signed)
Anesthesia Post Note  Patient: Richard Oliver  Procedure(s) Performed: IMPLANTATION OF HYPOGLOSSAL NERVE STIMULATOR (Right: Chest)     Patient location during evaluation: PACU Anesthesia Type: General Level of consciousness: awake and alert Pain management: pain level controlled Vital Signs Assessment: post-procedure vital signs reviewed and stable Respiratory status: spontaneous breathing, nonlabored ventilation, respiratory function stable and patient connected to nasal cannula oxygen Cardiovascular status: blood pressure returned to baseline and stable Postop Assessment: no apparent nausea or vomiting Anesthetic complications: no   No notable events documented.  Last Vitals:  Vitals:   09/01/23 2057 09/02/23 0417  BP: 134/75 115/73  Pulse: 81 71  Resp: 19 18  Temp: 36.6 C 36.8 C  SpO2: 96% 99%    Last Pain:  Vitals:   09/02/23 0417  TempSrc: Oral  PainSc:                  [facility] Teeg[facility] S
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
## 2023-10-13 ENCOUNTER — Encounter (HOSPITAL_BASED_OUTPATIENT_CLINIC_OR_DEPARTMENT_OTHER): Payer: Self-pay | Admitting: Pulmonary Disease

## 2023-10-13 ENCOUNTER — Ambulatory Visit (HOSPITAL_BASED_OUTPATIENT_CLINIC_OR_DEPARTMENT_OTHER): Payer: Medicare Other | Admitting: Pulmonary Disease

## 2023-10-13 VITALS — BP 138/86 | HR 74 | Resp 18 | Ht 64.0 in | Wt 175.9 lb

## 2023-10-13 DIAGNOSIS — G4733 Obstructive sleep apnea (adult) (pediatric): Secondary | ICD-10-CM

## 2023-10-13 NOTE — Patient Instructions (Addendum)
Your device was activated today Start delay 26m Pause time 15 m Duration 9h  Level 1 = 0. 9V Increase by 1 level every week until tongue discomfort  Try to use every night

## 2023-10-13 NOTE — Progress Notes (Signed)
   Subjective:    [name], male    DOB: 1966-02-08, 57 y.o.   MRN: [id number]  HPI  57 y.o. male with obstructive sleep apnea and obesity hypoventilation syndrome.   He resides at [facility].   He was using oxygen at night, currently off  PMH : Severe mental retardation, Seizures, Scoliosis, Glaucoma, Cerebral palsy, COVID April 2023   Activation visit today, accompanied by caregiver and dad.   Assessed baseline cognitive status Bedtime 8:30 PM, sleep latency minimal, wakes up at 6 AM  Significant tests/ events reviewed PSG 12/28/07 >> AHI 35  CPAP 05/17/13 to 08/14/13 >> Used on 63 of 90 nights with average 2 hrs 42 min. Avearge AHI 0.7 with CPAP 17 cm H2O. ONO with RA 02/12/21 >> test time 9 hrs 50 min.  Baseline SpO2 92%, low SpO2 64%.  Spent 1 hr 30 min with SpO2 < 88%. HST 09/29/22 >> AHI 50.2, SpO2 low 59%   Review of Systems neg for any significant sore throat, dysphagia, itching, sneezing, nasal congestion or excess/ purulent secretions, fever, chills, sweats, unintended wt loss, pleuritic or exertional cp, hempoptysis, orthopnea pnd or change in chronic leg swelling. Also denies presyncope, palpitations, heartburn, abdominal pain, nausea, vomiting, diarrhea or change in bowel or urinary habits, dysuria,hematuria, rash, arthralgias, visual complaints, headache, numbness weakness or ataxia.     Objective:   Physical Exam  Gen. Pleasant, obese, in no distress, wheelchair with belt ENT - no lesions, no post nasal drip, tongue protrusion deviated to right due to torticollis Neck: No JVD, no thyromegaly, no carotid bruits Lungs: no use of accessory muscles, no dullness to percussion, decreased without rales or rhonchi  Cardiovascular: Rhythm regular, heart sounds  normal, no murmurs or gallops, no peripheral edema Musculoskeletal: No deformities, no cyanosis or clubbing , no tremors       Assessment & Plan:    This was an extremely difficult activation  due to patient's poor cognitive status and had to rely on caregiver to interpret and dad to assist.  Also instructions had to be relayed to care providers.  We may have to use acclimatization pathway

## 2023-10-13 NOTE — Assessment & Plan Note (Signed)
Hypoglossal nerve stimulator was activated today.  Incision was checked, tongue protrusion was examined Programming : The activation workflow was followed 1.  Stimulation level : Sensation 0.6 V , functional level 0.9 V lower limit, upper limit 1.9 V 2.  Start delay 30 minutes, pause time 15 minutes, duration 9 hours 3.  Sensing waveform was analyzed for 3 minutes 4.  Sleep remote education was provided patient demonstrated competency with the remote and was aware of patient Instruction videos and sleep remote guide 5.  Patient was instructed to step up levels by 1 level (0.1 V ) every week 6.  Check-in visit in 1 month after activation visit to ensure that they are stepping up levels, using therapy " all night, every night" and to evaluate subjective benefit.   7.  Inspire titration sleep study will be scheduled 3 months after the activation visit

## 2023-11-23 ENCOUNTER — Ambulatory Visit (HOSPITAL_BASED_OUTPATIENT_CLINIC_OR_DEPARTMENT_OTHER): Payer: Medicare Other | Admitting: Pulmonary Disease

## 2024-01-05 ENCOUNTER — Encounter (HOSPITAL_BASED_OUTPATIENT_CLINIC_OR_DEPARTMENT_OTHER): Payer: Self-pay | Admitting: Pulmonary Disease

## 2024-01-05 ENCOUNTER — Ambulatory Visit (HOSPITAL_BASED_OUTPATIENT_CLINIC_OR_DEPARTMENT_OTHER): Payer: Medicare Other | Admitting: Pulmonary Disease

## 2024-01-05 VITALS — BP 122/80 | HR 70 | Resp 18 | Ht 64.0 in | Wt 205.0 lb

## 2024-01-05 DIAGNOSIS — Z9682 Presence of neurostimulator: Secondary | ICD-10-CM

## 2024-01-05 DIAGNOSIS — G4733 Obstructive sleep apnea (adult) (pediatric): Secondary | ICD-10-CM

## 2024-01-05 NOTE — Progress Notes (Signed)
   Subjective:    [name], male    DOB: 1966-06-10, 58 y.o.   MRN: [id number]  HPI  58 y.o. male with obstructive sleep apnea and obesity hypoventilation syndrome.   He resides at [facility].   He was using oxygen at night, currently off   PMH : Severe mental retardation, Seizures, Scoliosis, Glaucoma, Cerebral palsy, COVID April 2023    10/13/23 Activation visit , accompanied by caregiver and dad.  Bedtime 8:30 PM, sleep latency minimal, wakes up at 6 AM Sensation 0.6 V , functional level 0.9 V lower limit, upper limit 1.9 V  Start delay 30 minutes, pause time 15 minutes, duration 9 hours  Post activation visit History obtained from dad that he is still snoring.  Remains sleepy in the daytime. Download shows that he is still at 0.9 V.  Usage is good 87% with 4 missed nights, no pauses.     Significant tests/ events reviewed PSG 12/28/07 >> AHI 35  CPAP 05/17/13 to 08/14/13 >> Used on 63 of 90 nights with average 2 hrs 42 min. Avearge AHI 0.7 with CPAP 17 cm H2O. ONO with RA 02/12/21 >> test time 9 hrs 50 min.  Baseline SpO2 92%, low SpO2 64%.  Spent 1 hr 30 min with SpO2 < 88%. HST 09/29/22 >> AHI 50.2, SpO2 low 59%   Review of Systems neg for any significant sore throat, dysphagia, itching, sneezing, nasal congestion or excess/ purulent secretions, fever, chills, sweats, unintended wt loss, pleuritic or exertional cp, hempoptysis, orthopnea pnd or change in chronic leg swelling. Also denies presyncope, palpitations, heartburn, abdominal pain, nausea, vomiting, diarrhea or change in bowel or urinary habits, dysuria,hematuria, rash, arthralgias, visual complaints, headache, numbness weakness or ataxia.     Objective:   Physical Exam  Gen. Pleasant, obese, in no distress, in wheelchair ENT - no lesions, no post nasal drip, tongue toleft, head to left Neck: No JVD, no thyromegaly, no carotid bruits Lungs: no use of accessory muscles, no dullness to percussion,  decreased without rales or rhonchi  Cardiovascular: Rhythm regular, heart sounds  normal, no murmurs or gallops, no peripheral edema Musculoskeletal: No deformities, no cyanosis or clubbing , no tremors       Assessment & Plan:    OSA status post hypoglossal nerve stimulator implant.  -Device was interrogated with programmer.  Download was reviewed. He is compliant but caregiver has not increased levels as recommended on last visit. We examined tongue protrusion already up to 1.7 V and he seems to tolerate well.  He is an excellent time deviation to the left Reconfigured his device to 1.1 V today, level 3. Educated caregiver on how to increase levels every week  Weight loss encouraged, compliance with goal all nigh/every night is the expectation. Advised against medications with sedative side effects Cautioned against driving when sleepy - understanding that sleepiness will vary on a day to day basis

## 2024-01-05 NOTE — Patient Instructions (Signed)
 We increased to level 3   Increase by 1 level every week until tongue discomfort  Try to use every night

## 2024-02-01 ENCOUNTER — Encounter: Payer: Self-pay | Admitting: Pulmonary Disease

## 2024-02-29 ENCOUNTER — Ambulatory Visit (HOSPITAL_BASED_OUTPATIENT_CLINIC_OR_DEPARTMENT_OTHER): Admitting: Pulmonary Disease

## 2024-03-02 ENCOUNTER — Encounter: Payer: Self-pay | Admitting: Family Medicine

## 2024-03-02 ENCOUNTER — Ambulatory Visit
Admission: RE | Admit: 2024-03-02 | Discharge: 2024-03-02 | Disposition: A | Discharge status: Home / Self Care | Source: Ambulatory Visit | Attending: Family Medicine | Admitting: Family Medicine

## 2024-03-02 ENCOUNTER — Other Ambulatory Visit: Payer: Self-pay | Admitting: Family Medicine

## 2024-03-02 DIAGNOSIS — M25542 Pain in joints of left hand: Secondary | ICD-10-CM

## 2024-04-10 ENCOUNTER — Ambulatory Visit (HOSPITAL_BASED_OUTPATIENT_CLINIC_OR_DEPARTMENT_OTHER): Admitting: Pulmonary Disease

## 2024-05-17 ENCOUNTER — Ambulatory Visit (HOSPITAL_BASED_OUTPATIENT_CLINIC_OR_DEPARTMENT_OTHER): Admitting: Pulmonary Disease

## 2024-05-17 ENCOUNTER — Encounter (HOSPITAL_BASED_OUTPATIENT_CLINIC_OR_DEPARTMENT_OTHER): Payer: Self-pay | Admitting: Pulmonary Disease

## 2024-05-17 VITALS — BP 138/85 | Ht 64.0 in | Wt 182.0 lb

## 2024-05-17 DIAGNOSIS — G4733 Obstructive sleep apnea (adult) (pediatric): Secondary | ICD-10-CM | POA: Diagnosis not present

## 2024-05-17 DIAGNOSIS — Z9682 Presence of neurostimulator: Secondary | ICD-10-CM

## 2024-05-17 NOTE — Patient Instructions (Signed)
  VISIT SUMMARY: Today, you were seen for an evaluation of your sleep patterns and to discuss a potential sleep study. You have been experiencing fluctuations in your alertness and energy levels, sometimes feeling sleepy during the day. You have also lost some weight recently.   YOUR PLAN: -OBSTRUCTIVE SLEEP APNEA: Obstructive sleep apnea is a condition where your breathing stops and starts during sleep due to blocked airways. We will schedule a sleep study to evaluate the settings of your sleep apnea device, which currently operates at 1.7 volts. During the study, we will test different settings to find the best one for you. Your caregiver will be present to help with the setup and ensure your comfort. After the study, we will adjust your device to the optimal level.   INSTRUCTIONS: Please schedule a sleep study to evaluate the settings of your sleep apnea device. Ensure that your caregiver, is present during the study to assist with the setup and provide comfort. After the study, we will adjust your device to the optimal level based on the results.                      Contains text generated by Abridge.                                 Contains text generated by Abridge.

## 2024-05-17 NOTE — Progress Notes (Signed)
 Subjective:    [name], male    DOB: 02-Sep-1966, 58 y.o.   MRN: [id number]   58 y.o. male with obstructive sleep apnea and obesity hypoventilation syndrome.   He resides at [facility].   He was using oxygen at night, currently off   PMH : Severe mental retardation, Seizures, Scoliosis, Glaucoma, Cerebral palsy, COVID April 2023    10/13/23 Activation visit , accompanied by caregiver and dad.  Bedtime 8:30 PM, sleep latency minimal, wakes up at 6 AM Sensation 0.6 V , functional level 0.9 V lower limit, upper limit 1.9 V  Start delay 30 minutes, pause time 15 minutes, duration 9 hours  12/2023 OV >> outgoing 1.1 V , level 3.   Discussed the use of AI scribe software for clinical note transcription with the patient, who gave verbal consent to proceed.  History of Present Illness Richard Oliver is a 58 year old male with obstructive sleep apnea who presents for evaluation of his sleep patterns and potential sleep study.  He experiences fluctuations in alertness and energy levels, appearing more alert at home but sometimes sleepy during the day program. He occasionally stays up all night, leading to daytime sleepiness, and sometimes falls asleep while watching wrestling. He has lost weight, dropping from 200 pounds to 183 pounds. No soreness in the tongue or other specific symptoms related to his OSA device are present. His current caregiver, Randall, is familiar with his routine and device management.   Lost 22 lbs !  Incoming 1.7 V  -Device was interrogated with programmer.  Download was reviewed.   Significant tests/ events reviewed PSG 12/28/07 >> AHI 35  CPAP 05/17/13 to 08/14/13 >> Used on 63 of 90 nights with average 2 hrs 42 min. Avearge AHI 0.7 with CPAP 17 cm H2O. ONO with RA 02/12/21 >> test time 9 hrs 50 min.  Baseline SpO2 92%, low SpO2 64%.  Spent 1 hr 30 min with SpO2 < 88%. HST 09/29/22 >> AHI 50.2, SpO2 low 59%     Review of Systems  neg for any  significant sore throat, dysphagia, itching, sneezing, nasal congestion or excess/ purulent secretions, fever, chills, sweats, unintended wt loss, pleuritic or exertional cp, hempoptysis, orthopnea pnd or change in chronic leg swelling. Also denies presyncope, palpitations, heartburn, abdominal pain, nausea, vomiting, diarrhea or change in bowel or urinary habits, dysuria,hematuria, rash, arthralgias, visual complaints, headache, numbness weakness or ataxia.      Objective:   Physical Exam  Gen. Pleasant, obese, in no distress ENT - no lesions, no post nasal drip Neck: No JVD, no thyromegaly, no carotid bruits Lungs: no use of accessory muscles, no dullness to percussion, decreased without rales or rhonchi  Cardiovascular: Rhythm regular, heart sounds  normal, no murmurs or gallops, no peripheral edema Musculoskeletal: No deformities, no cyanosis or clubbing , no tremors       Assessment & Plan:   Assessment and Plan Assessment & Plan Obstructive Sleep Apnea Obstructive sleep apnea. Current treatment involves a hypervascular osteomotor implant with settings at 1.7 volts. Variable daytime alertness may be influenced by sleep apnea or sleep patterns. - Schedule sleep study to evaluate treatment levels from 1.1 to 1.7 volts. - Ensure caregiver presence during the sleep study for setup and comfort. - Evaluate apnea events at each level to determine optimal therapeutic amplitude. - Adjust device to optimal level post-study.  Intellectual Disabilities Intellectual disabilities requiring caregiver support for daily activities and medical management. Benefits from structured routines  and familiar caregivers for device management and daily living activities. - Maintain consistent caregiver support for medical appointments and device management.

## 2024-05-19 ENCOUNTER — Ambulatory Visit (HOSPITAL_BASED_OUTPATIENT_CLINIC_OR_DEPARTMENT_OTHER): Admitting: Pulmonary Disease

## 2024-07-13 ENCOUNTER — Encounter (HOSPITAL_BASED_OUTPATIENT_CLINIC_OR_DEPARTMENT_OTHER): Admitting: Pulmonary Disease

## 2024-07-17 ENCOUNTER — Ambulatory Visit

## 2024-07-21 ENCOUNTER — Telehealth: Payer: Self-pay

## 2024-07-21 NOTE — Telephone Encounter (Signed)
 Called to get patient re scheduled per provider.Please makes sure he is set up for a NP slot

## 2024-07-25 ENCOUNTER — Ambulatory Visit

## 2024-07-25 DIAGNOSIS — F79 Unspecified intellectual disabilities: Secondary | ICD-10-CM | POA: Diagnosis not present

## 2024-07-25 DIAGNOSIS — B351 Tinea unguium: Secondary | ICD-10-CM

## 2024-07-25 NOTE — Progress Notes (Signed)
  Subjective:  [name], male    DOB: [birth year],  MRN: [id number]  58 y.o. male presents with chief concern of elongated, thickened, painful, discolored toenails for years. Patient has tried none. He is in a wheelchair, suffers from cerebral palsy, OSA, and has intellectual disabilities. These make him unable to reach his own nails for regular maintenance. He is present with a caregiver. He is unable to communicate his history, above is from chart review.   Chief Complaint  Patient presents with   Nail Problem    Rm13 Patient complains of thick and yellow toenails/no pain or treatment     PCP is [facility], [facility]   No Known Allergies  Review of Systems: Negative except as noted in the HPI.   Objective:  Richard Oliver is a pleasant 58 y.o. male AAO x 3.  Vascular Examination: Faintly palpable pedal pulses. CFT immediate b/l. Pedal hair present. No edema. No pain with calf compression b/l. Skin temperature gradient WNL b/l. No varicosities noted. No cyanosis or clubbing noted.  Neurological Examination: Sensation grossly intact b/l with 10 gram monofilament. Vibratory sensation intact b/l.  Dermatological Examination: Pedal skin with normal turgor, texture and tone b/l. No open wounds nor interdigital macerations noted. Toenails 1-5 b/l thick, discolored, elongated with subungual debris and pain on dorsal palpation. No hyperkeratotic lesions noted b/l.   Musculoskeletal Examination: Muscle strength 5/5 to b/l LE.  No pain, crepitus noted b/l. No gross pedal deformities.     Assessment:   1. Onychomycosis   2. Intellectual disability     Plan:  Consent given for treatment. Patient examined. All patient's and/or POA's questions/concerns addressed on today's visit.Toenails were debrided in length and girth without incident.Continue soft, supportive shoe gear daily. Report any pedal injuries to medical professional. Call office if there are any  questions/concerns. -Patient/POA to call should there be question/concern in the interim.  RTC 3 months or as needed  Prentice Ovens, DPM      [facility] LOCATION: [address] [place] [address]                   Office [phone]   [facility] LOCATION: [address] [place], [place] [address] Office [phone]

## 2024-08-03 ENCOUNTER — Ambulatory Visit (HOSPITAL_BASED_OUTPATIENT_CLINIC_OR_DEPARTMENT_OTHER): Admitting: Pulmonary Disease

## 2024-08-29 ENCOUNTER — Encounter (HOSPITAL_BASED_OUTPATIENT_CLINIC_OR_DEPARTMENT_OTHER): Admitting: Pulmonary Disease

## 2024-08-29 ENCOUNTER — Ambulatory Visit (HOSPITAL_BASED_OUTPATIENT_CLINIC_OR_DEPARTMENT_OTHER): Attending: Pulmonary Disease | Admitting: Pulmonary Disease

## 2024-08-29 DIAGNOSIS — Z9682 Presence of neurostimulator: Secondary | ICD-10-CM | POA: Diagnosis present

## 2024-08-29 DIAGNOSIS — G4733 Obstructive sleep apnea (adult) (pediatric): Secondary | ICD-10-CM | POA: Diagnosis present

## 2024-08-31 ENCOUNTER — Ambulatory Visit: Payer: Self-pay | Admitting: Pulmonary Disease

## 2024-08-31 NOTE — Procedures (Signed)
 [facility] [address] [place], [place] [address] Tel: [phone]   Fax: [phone]  Inspire Interpretation  Patient Name:  Richard Oliver, Richard Oliver Date:  08/29/2024 Referring Physician:  HARDEN Valyncia Wiens [phone]) %%startinterp%% Indications for Polysomnography The patient is a 58 year old Male who is 5' 4 and weighs 205.0 lbs. His BMI equals 35.4.  A full night inspire treatment study was performed.   Polysomnogram Data A full night polysomnogram recorded the standard physiologic parameters including EEG, EOG, EMG, EKG, nasal and oral airflow.  Respiratory parameters of chest and abdominal movements were recorded with Respiratory Inductance Plethysmography belts.  Oxygen saturation was recorded by pulse oximetry.   Sleep Architecture The total recording time of the polysomnogram was 479.0 minutes.  The total sleep time was 308.5 minutes.  The patient spent 1.3% of total sleep time in Stage N1, 98.7% in Stage N2, 0.0% in Stages N3, and 0.0% in REM.  Sleep latency was 150.0 minutes.  REM latency was - minutes.  Sleep Efficiency was 64.4%.  Wake after Sleep Onset time was 21.0 minutes.  Inspire Summary The patient was on inspire therapy at levels ranging from 1.1* volts up to 2.7* volts.  The last level used in the study was 2.7* volts.  Respiratory Events The polysomnogram revealed a presence of 41 obstructive, 12 central, and 7 mixed apneas resulting in an Apnea index of 11.7 events per hour.  There were 223 hypopneas (>=3% desaturation and/or arousal) resulting in an Apnea\Hypopnea Index (AHI >=3% desaturation and/or arousal) of 55.0 events per hour.  There were 210 hypopneas (>=4% desaturation) resulting in an Apnea\Hypopnea Index (AHI >=4% desaturation) of 52.5 events per hour.  There were - Respiratory Effort Related Arousals resulting in a RERA index of - events per hour. The Respiratory Disturbance Index is 55.0 events per hour.  The snore index was 253.4  events per hour.  Mean oxygen saturation was 87.5%.  The lowest oxygen saturation during sleep was 63.0%.  Time spent <=88% oxygen saturation was 211.8 minutes (44.3%).  Limb Activity There were - limb movements recorded.  Of this total, - were classified as PLMs.  Of the PLMs, - were associated with arousals.  The Limb Movement index was - per hour while the PLM index was - per hour.  Cardiac Summary The average pulse rate was 74.3 bpm.  The minimum pulse rate was 59.0 bpm while the maximum pulse rate was 101.0 bpm.  Cardiac rhythm was normal/abnormal.  Comments: The patient arrived at an amplitude of 1.7 v. Per the MD's order, the amplitude was set at 1.1 v and was only increased. The device was turned on and was started at 1.1 v and was increased to 2.7 v for obstructive events.            The patient slept in the supine position. The patient normally sleeps inclined in a hospital bed. The patient was propped up on pillows as best as he could.  Diagnosis: Severe OSA s/p hypoglossal nerve stimulator implant  Recommendations: Therapeutic amplitude was not achieved. Consider alternative electrode configurations   This study was personally reviewed and electronically signed by: [name]., MD Accredited Board Certified in Sleep Medicine

## 2024-09-04 NOTE — Progress Notes (Signed)
 Richard Oliver is scheduled for Thursday 11/13 @13 :30. Updated Inspire reps they will be in attendance.

## 2024-09-07 ENCOUNTER — Ambulatory Visit (INDEPENDENT_AMBULATORY_CARE_PROVIDER_SITE_OTHER): Admitting: Pulmonary Disease

## 2024-09-07 ENCOUNTER — Encounter (HOSPITAL_BASED_OUTPATIENT_CLINIC_OR_DEPARTMENT_OTHER): Payer: Self-pay | Admitting: Pulmonary Disease

## 2024-09-07 VITALS — BP 136/84 | HR 80 | Ht 64.0 in | Wt 182.0 lb

## 2024-09-07 DIAGNOSIS — G4733 Obstructive sleep apnea (adult) (pediatric): Secondary | ICD-10-CM

## 2024-09-07 DIAGNOSIS — Z9682 Presence of neurostimulator: Secondary | ICD-10-CM

## 2024-09-07 NOTE — Progress Notes (Signed)
 Subjective:    [name], male    DOB: November 23, 1965, 58 y.o.   MRN: [id number]     58 y.o. male with obstructive sleep apnea and obesity hypoventilation syndrome.   He resides at [facility]   He was using oxygen at night, currently off   PMH : Severe mental retardation, Seizures, Scoliosis, Glaucoma, Cerebral palsy, COVID April 2023    10/13/23 Activation visit , accompanied by caregiver and dad.  Bedtime 8:30 PM, sleep latency minimal, wakes up at 6 AM Sensation 0.6 V , functional level 0.9 V lower limit, upper limit 1.9 V  Start delay 30 minutes, pause time 15 minutes, duration 9 hours  04/2024 incoming 1.7 V    Discussed the use of AI scribe software for clinical note transcription with the patient, who gave verbal consent to proceed.  History of Present Illness Richard Oliver is a 58 year old male who presents for a titration study of his hypoglossal nerve stimulator implant. He is accompanied by Gabriella, his daytime caregiver.  He is undergoing a titration study to optimize the hypoglossal nerve stimulator implant settings. Despite feeling generally better, he experiences occasional tiredness. His sleep patterns show loud snoring, sometimes audible from another room. The device is set to turn off after nine hours, but there is concern he might sleep longer than the device's active period. He typically sleeps from 7:30 PM to 6:00 AM.  A recent sleep study revealed episodes of apnea at various stimulation levels, tested from 1.1 to 2.7. He uses the device approximately 44 hours per week, which is less than expected for consistent nine-hour nightly use. Caregivers manage the device, and there is concern about consistent nightly activation. The device uses two AA batteries, and caregivers are advised to monitor battery status.    Significant tests/ events reviewed PSG 12/28/07 >> AHI 35  CPAP 05/17/13 to 08/14/13 >> Used on 63 of 90 nights with average 2 hrs 42 min.  Avearge AHI 0.7 with CPAP 17 cm H2O. ONO with RA 02/12/21 >> test time 9 hrs 50 min.  Baseline SpO2 92%, low SpO2 64%.  Spent 1 hr 30 min with SpO2 < 88%. HST 09/29/22 >> AHI 50.2, SpO2 low 59%   Review of Systems  neg for any significant sore throat, dysphagia, itching, sneezing, nasal congestion or excess/ purulent secretions, fever, chills, sweats, unintended wt loss, pleuritic or exertional cp, hempoptysis, orthopnea pnd or change in chronic leg swelling. Also denies presyncope, palpitations, heartburn, abdominal pain, nausea, vomiting, diarrhea or change in bowel or urinary habits, dysuria,hematuria, rash, arthralgias, visual complaints, headache, numbness weakness or ataxia.      Objective:   Physical Exam  Gen. Pleasant, obese, in no distress ENT - no lesions, no post nasal drip Neck: No JVD, no thyromegaly, no carotid bruits Lungs: no use of accessory muscles, no dullness to percussion, decreased without rales or rhonchi  Cardiovascular: Rhythm regular, heart sounds  normal, no murmurs or gallops, no peripheral edema Musculoskeletal: No deformities, no cyanosis or clubbing , no tremors  Tongue stimulation/protrusion checked with programmer at multiple levels/ electrodes B,C & D , best protrusion with B      Assessment & Plan:   Assessment and Plan Assessment & Plan Obstructive sleep apnea, status post hypoglossal nerve stimulator Obstructive sleep apnea persists despite hypoglossal nerve stimulator implant. Recent titration study showed apneic episodes at various stimulation levels. Current program A is ineffective 10-20% of the time. Plan B and C are alternatives. Device usage  is less than expected, possibly due to improper activation or battery issues. Apneic episodes are not considered dangerous due to long-standing condition. - Switched to program B and instructed to titrate weekly from level 2 to 4. - Will schedule awake drug-induced sleep endoscopy to evaluate all four  programs and determine optimal settings. - Ensure caregivers are trained to activate the device properly. - Monitor battery status and replace if necessary. -New range 0.8-1.3 , started on elec B 0.9

## 2024-09-07 NOTE — Addendum Note (Signed)
 Addended by: [name] on: 09/07/2024 01:52 PM   Modules accepted: Orders

## 2024-09-07 NOTE — H&P (View-Only) (Signed)
 Subjective:    [name], male    DOB: November 23, 1965, 58 y.o.   MRN: [id number]     58 y.o. male with obstructive sleep apnea and obesity hypoventilation syndrome.   He resides at [facility]   He was using oxygen at night, currently off   PMH : Severe mental retardation, Seizures, Scoliosis, Glaucoma, Cerebral palsy, COVID April 2023    10/13/23 Activation visit , accompanied by caregiver and dad.  Bedtime 8:30 PM, sleep latency minimal, wakes up at 6 AM Sensation 0.6 V , functional level 0.9 V lower limit, upper limit 1.9 V  Start delay 30 minutes, pause time 15 minutes, duration 9 hours  04/2024 incoming 1.7 V    Discussed the use of AI scribe software for clinical note transcription with the patient, who gave verbal consent to proceed.  History of Present Illness Richard Oliver is a 58 year old male who presents for a titration study of his hypoglossal nerve stimulator implant. He is accompanied by Gabriella, his daytime caregiver.  He is undergoing a titration study to optimize the hypoglossal nerve stimulator implant settings. Despite feeling generally better, he experiences occasional tiredness. His sleep patterns show loud snoring, sometimes audible from another room. The device is set to turn off after nine hours, but there is concern he might sleep longer than the device's active period. He typically sleeps from 7:30 PM to 6:00 AM.  A recent sleep study revealed episodes of apnea at various stimulation levels, tested from 1.1 to 2.7. He uses the device approximately 44 hours per week, which is less than expected for consistent nine-hour nightly use. Caregivers manage the device, and there is concern about consistent nightly activation. The device uses two AA batteries, and caregivers are advised to monitor battery status.    Significant tests/ events reviewed PSG 12/28/07 >> AHI 35  CPAP 05/17/13 to 08/14/13 >> Used on 63 of 90 nights with average 2 hrs 42 min.  Avearge AHI 0.7 with CPAP 17 cm H2O. ONO with RA 02/12/21 >> test time 9 hrs 50 min.  Baseline SpO2 92%, low SpO2 64%.  Spent 1 hr 30 min with SpO2 < 88%. HST 09/29/22 >> AHI 50.2, SpO2 low 59%   Review of Systems  neg for any significant sore throat, dysphagia, itching, sneezing, nasal congestion or excess/ purulent secretions, fever, chills, sweats, unintended wt loss, pleuritic or exertional cp, hempoptysis, orthopnea pnd or change in chronic leg swelling. Also denies presyncope, palpitations, heartburn, abdominal pain, nausea, vomiting, diarrhea or change in bowel or urinary habits, dysuria,hematuria, rash, arthralgias, visual complaints, headache, numbness weakness or ataxia.      Objective:   Physical Exam  Gen. Pleasant, obese, in no distress ENT - no lesions, no post nasal drip Neck: No JVD, no thyromegaly, no carotid bruits Lungs: no use of accessory muscles, no dullness to percussion, decreased without rales or rhonchi  Cardiovascular: Rhythm regular, heart sounds  normal, no murmurs or gallops, no peripheral edema Musculoskeletal: No deformities, no cyanosis or clubbing , no tremors  Tongue stimulation/protrusion checked with programmer at multiple levels/ electrodes B,C & D , best protrusion with B      Assessment & Plan:   Assessment and Plan Assessment & Plan Obstructive sleep apnea, status post hypoglossal nerve stimulator Obstructive sleep apnea persists despite hypoglossal nerve stimulator implant. Recent titration study showed apneic episodes at various stimulation levels. Current program A is ineffective 10-20% of the time. Plan B and C are alternatives. Device usage  is less than expected, possibly due to improper activation or battery issues. Apneic episodes are not considered dangerous due to long-standing condition. - Switched to program B and instructed to titrate weekly from level 2 to 4. - Will schedule awake drug-induced sleep endoscopy to evaluate all four  programs and determine optimal settings. - Ensure caregivers are trained to activate the device properly. - Monitor battery status and replace if necessary. -New range 0.8-1.3 , started on elec B 0.9

## 2024-09-07 NOTE — Patient Instructions (Signed)
 Your device was changed to program B  Level 1 = 0.8 V Increase by 1 level every week until tongue discomfort  Try to use every night    We will arrange for awake endoscopy

## 2024-09-08 ENCOUNTER — Telehealth (HOSPITAL_BASED_OUTPATIENT_CLINIC_OR_DEPARTMENT_OTHER): Payer: Self-pay

## 2024-09-08 NOTE — Telephone Encounter (Signed)
 Left Crystal a message to notify of the Inspire phone number to call [phone]  Copied from CRM [id number]. Topic: Clinical - Medical Advice >> Sep 08, 2024  1:20 PM Isabell A wrote: Reason for CRM: Camelia Delude, nurse from [facility] has questions in regard to the notes for his INSPIRE device.   Callback number: [phone] ask for nursing dept.

## 2024-09-12 ENCOUNTER — Ambulatory Visit (HOSPITAL_COMMUNITY): Admission: RE | Admit: 2024-09-12 | Source: Home / Self Care | Admitting: Pulmonary Disease

## 2024-09-12 ENCOUNTER — Encounter (HOSPITAL_COMMUNITY): Admission: RE | Payer: Self-pay | Source: Home / Self Care

## 2024-09-12 DIAGNOSIS — G4733 Obstructive sleep apnea (adult) (pediatric): Secondary | ICD-10-CM | POA: Insufficient documentation

## 2024-09-12 SURGERY — DRUG INDUCED SLEEP ENDOSCOPY
Anesthesia: IV Sedation (MBSC Only)

## 2024-09-14 ENCOUNTER — Telehealth: Payer: Self-pay | Admitting: Pulmonary Disease

## 2024-09-14 DIAGNOSIS — G4733 Obstructive sleep apnea (adult) (pediatric): Secondary | ICD-10-CM

## 2024-09-14 NOTE — Telephone Encounter (Signed)
 Reached out to the facility where Richard Oliver resides, spoke with his nurse CiCi and updated her that Richard Oliver procedure is on Tuesday, 12/2, time has been moved to 9:30. They are aware. I also called patient's father to update him, he is aware but will not be able to attend the procedure. Additionally, I notified the [employer] reps, and they are aware and will be in attendance. Nothing further needed.

## 2024-09-14 NOTE — Telephone Encounter (Signed)
 DISE scheduled for 12/2 @ 8-30 am Please inform pt & [employer] rep   Please schedule the following:  Provider performing procedure:Jarret Torre Diagnosis: OSA Which side if for nodule / mass? NA Procedure: DISE  Has patient been spoken to by Provider and given informed consent? Yes Anesthesia: Propofol  Do you need Fluro? NA Duration of procedure: 30 m Date: 12/2  Alternate Date: 12/3  Time: 8-30  AM Location: [facility] Does patient have OSA? Y DM? NA Or Latex allergy? NA Medication Restriction/ Anticoagulate/Antiplatelet: NA Pre-op Labs Ordered:determined by Anesthesia Imaging request: NA

## 2024-09-18 ENCOUNTER — Telehealth (HOSPITAL_BASED_OUTPATIENT_CLINIC_OR_DEPARTMENT_OTHER): Payer: Self-pay

## 2024-09-18 NOTE — Telephone Encounter (Signed)
 Left message with [employer] phone number of [phone] to trouble shoot device    Copied from CRM #[id number]. Topic: Clinical - Medical Advice >> Sep 18, 2024 10:43 AM Isabell A wrote: Reason for CRM: Charmian Daring [facility] services, nurse requesting advice in regard to patients INSPIRE device.   Callback number: [phone] ext [phone] ok to leave voicemail

## 2024-09-20 ENCOUNTER — Telehealth (HOSPITAL_BASED_OUTPATIENT_CLINIC_OR_DEPARTMENT_OTHER): Payer: Self-pay

## 2024-09-20 NOTE — Telephone Encounter (Signed)
 LOV notes with titration instructions highlighted and faxed to number listed for [facility] @ [phone]   Copied from CRM #[id number]. Topic: Clinical - Order For Equipment >> Sep 20, 2024 10:26 AM Devaughn RAMAN wrote: Reason for CRM: [facility]- Caleria  called to inquire about pt's Inspire device, Caleria would like to have a fax sent over regarding what the setting the device is supposed to be on, when it started, and how much are they goinng up on the device. Contacted [employer] spoke with [facility] stated she contacted Bailie Christenbury currently with pts and will f/u advised Caleria was thankful and verbalized understanding.   [facility] Phone-[phone] ext: [phone] [phone]

## 2024-09-25 ENCOUNTER — Telehealth (HOSPITAL_BASED_OUTPATIENT_CLINIC_OR_DEPARTMENT_OTHER): Payer: Self-pay

## 2024-09-25 NOTE — Telephone Encounter (Unsigned)
 Copied from CRM #[id number]. Topic: General - Other >> Sep 25, 2024  4:25 PM Celestine FALCON wrote: Reason for CRM: Charmian Daring with [facility] called to ask the clinic to call the pt's guardian regarding the procedure for tomorrow 09/26/2024. Due to the last minute of the contact, I reached out to the [place] clinic and spoke with Marval. Debbie tried multiple times and didn't see the DRUG INDUCED SLEEP ENDOSCOPY surgery for 09/26/24 at 930am with Dr. Jude. She said to send a CRM back to the clinical staff so someone from triage would call and assist. Pt's father's phone number Won Kreuzer is [phone] and Elane Hartel's number is [phone].

## 2024-09-25 NOTE — Telephone Encounter (Signed)
 Called patient's dad, Richard Oliver, regarding the procedure scheduled for tomorrow, 12/2. Richard Oliver requested that I contact the facility, [facility] as he is unable to attend due to having his own eye surgery tomorrow. He advised that if facility does not answer, to please call him back. Called [facility] to speak with patient Richard Oliver nurse Richard Oliver, no answer, left a voicemail requesting she reach out to patient's designated person dad Richard Oliver regarding the DISE scheduled for 09/26/24.   Called Richard Oliver patient's father again and informed him that I left voicemail requesting nurse Richard Oliver reach out to him. He acknowledge the patient Richard Oliver is still scheduled for DISE procedure Tuesday, 12/2, stated he will call facility, and confirmed he understands.

## 2024-09-25 NOTE — Telephone Encounter (Signed)
 Spoke with pts father and he will reach out to [place] and give her password as we do not know that information as the pt or caregiver sets this up. I notified her on VM     Copied from CRM [id number]. Topic: Clinical - Order For Equipment >> Sep 20, 2024 10:26 AM Devaughn RAMAN wrote: Reason for CRM: [facility]- Caleria  called to inquire about pt's Inspire device, Caleria would like to have a fax sent over regarding what the setting the device is supposed to be on, when it started, and how much are they goinng up on the device. Contacted [employer] spoke with [employer] stated she contacted Tryson Lumley currently with pts and will f/u advised Caleria was thankful and verbalized understanding.   [facility] Phone-[phone] ext: [phone] Fax-[phone] >> Sep 25, 2024  3:15 PM Lavanda D wrote: Rosalin NOVAK with [facility] requesting an update on the levels for Mr. Wilkie - Note from [place] states that she faxed the LOV notes with titration instructions highlighted. Verified correct fax # for Cal[employer]. She checked her mailbox and she has received the fax but is needing the password for the tablet.  [facility] Phone-[phone] ext: [phone]

## 2024-09-26 ENCOUNTER — Ambulatory Visit (HOSPITAL_COMMUNITY)
Admission: RE | Admit: 2024-09-26 | Discharge: 2024-09-26 | Disposition: A | Discharge status: Home / Self Care | Attending: Pulmonary Disease | Admitting: Pulmonary Disease

## 2024-09-26 ENCOUNTER — Encounter (HOSPITAL_COMMUNITY)
Admission: RE | Disposition: A | Discharge status: Home / Self Care | Payer: Self-pay | Source: Home / Self Care | Attending: Pulmonary Disease

## 2024-09-26 ENCOUNTER — Other Ambulatory Visit: Payer: Self-pay

## 2024-09-26 ENCOUNTER — Encounter (HOSPITAL_COMMUNITY): Payer: Self-pay

## 2024-09-26 ENCOUNTER — Encounter (HOSPITAL_COMMUNITY): Payer: Self-pay | Admitting: Pulmonary Disease

## 2024-09-26 DIAGNOSIS — G4733 Obstructive sleep apnea (adult) (pediatric): Secondary | ICD-10-CM | POA: Insufficient documentation

## 2024-09-26 SURGERY — CANCELLED PROCEDURE

## 2024-09-26 MED ORDER — PHENYLEPHRINE HCL 0.25 % NA SOLN
1.0000 | Freq: Four times a day (QID) | NASAL | Status: DC | PRN
  Filled 2024-09-26: qty 15

## 2024-09-26 MED ORDER — LIDOCAINE HCL URETHRAL/MUCOSAL 2 % EX GEL: 1.0000 | Freq: Once | CUTANEOUS | Status: DC

## 2024-09-26 MED ORDER — OXYMETAZOLINE HCL 0.05 % NA SOLN
NASAL | Status: AC
  Filled 2024-09-26: qty 15

## 2024-09-26 MED ORDER — LIDOCAINE HCL URETHRAL/MUCOSAL 2 % EX GEL
CUTANEOUS | Status: AC
  Filled 2024-09-26: qty 6

## 2024-09-26 NOTE — Anesthesia Preprocedure Evaluation (Addendum)
 Anesthesia Evaluation  Patient identified by MRN, date of birth, ID band Patient awake    Reviewed: Allergy & Precautions, H&P , NPO status , Patient's Chart, lab work & pertinent test results  History of Anesthesia Complications Negative for: history of anesthetic complications  Airway        Dental   Pulmonary sleep apnea  OSA, s/p hyoglossal nerve stimulator  in 2024          Cardiovascular (-) angina (-) Past MI      Neuro/Psych Seizures -,  Cerebral palsy Quadriplegia  negative psych ROS   GI/Hepatic negative GI ROS, Neg liver ROS,,,  Endo/Other  negative endocrine ROS    Renal/GU negative Renal ROS  negative genitourinary   Musculoskeletal negative musculoskeletal ROS (+)    Abdominal   Peds negative pediatric ROS (+)  Hematology negative hematology ROS (+)   Anesthesia Other Findings S/p hypoglossal nerve stimulator. Still having apneic episodes. Drug induced sleep endoscopy to try to identify more optimal stimulator settings   Reproductive/Obstetrics negative OB ROS                              Anesthesia Physical Anesthesia Plan  ASA: 3  Anesthesia Plan: MAC   Post-op Pain Management:    Induction: Intravenous  PONV Risk Score and Plan: 1 and Propofol  infusion and Treatment may vary due to age or medical condition  Airway Management Planned: Natural Airway  Additional Equipment:   Intra-op Plan:   Post-operative Plan:   Informed Consent: I have reviewed the patients History and Physical, chart, labs and discussed the procedure including the risks, benefits and alternatives for the proposed anesthesia with the patient or authorized representative who has indicated his/her understanding and acceptance.     Dental advisory given  Plan Discussed with: CRNA  Anesthesia Plan Comments: (Patient ate breakfast. Case cancelled by GI team.)         Anesthesia  Quick Evaluation

## 2024-09-26 NOTE — Progress Notes (Signed)
 Pt canceled for DISE today due to not being NPO. Patient discharged home with group home staff. Will be rescheduled.  Richard Oliver

## 2024-09-26 NOTE — Interval H&P Note (Signed)
 History and Physical Interval Note:  09/26/2024 9:16 AM  Richard Oliver  has presented today for surgery, with the diagnosis of OSA.  The various methods of treatment have been discussed with the patient and family. After consideration of risks, benefits and other options for treatment, the patient has consented to  Procedure(s): DRUG INDUCED SLEEP ENDOSCOPY (N/A) as a surgical intervention.  The patient's history has been reviewed, patient examined, no change in status, stable for surgery.  I have reviewed the patient's chart and labs.  Questions were answered to the patient's satisfaction.     [name] [name]

## 2024-10-03 ENCOUNTER — Telehealth (HOSPITAL_BASED_OUTPATIENT_CLINIC_OR_DEPARTMENT_OTHER): Payer: Self-pay

## 2024-10-03 NOTE — Telephone Encounter (Unsigned)
 Copied from CRM [id number]. Topic: Appointments - Scheduling Inquiry for Clinic >> Oct 03, 2024  2:27 PM Rozanna MATSU wrote: Fatima with [facility] was calling to reschedule the pt's procedure that he had to cancel on 12/02 please f/u with them at [phone] ext [phone].

## 2024-10-04 NOTE — Telephone Encounter (Signed)
 Florence Dallas from [employer] regarding Mr. Terrill Alperin device procedure, that was canceled on 12/02. She was unavailable, so I left a detailed voicemail stating that once the procedure is rescheduled, we will reach out to [facility] to provide all necessary details. No further action needed at this time.

## 2024-10-12 ENCOUNTER — Ambulatory Visit (HOSPITAL_BASED_OUTPATIENT_CLINIC_OR_DEPARTMENT_OTHER): Admitting: Pulmonary Disease

## 2024-10-17 ENCOUNTER — Ambulatory Visit (INDEPENDENT_AMBULATORY_CARE_PROVIDER_SITE_OTHER)

## 2024-10-17 DIAGNOSIS — F79 Unspecified intellectual disabilities: Secondary | ICD-10-CM | POA: Diagnosis not present

## 2024-10-17 DIAGNOSIS — M79675 Pain in left toe(s): Secondary | ICD-10-CM | POA: Diagnosis not present

## 2024-10-17 DIAGNOSIS — B351 Tinea unguium: Secondary | ICD-10-CM | POA: Diagnosis not present

## 2024-10-17 DIAGNOSIS — M79674 Pain in right toe(s): Secondary | ICD-10-CM | POA: Diagnosis not present

## 2024-10-17 NOTE — Progress Notes (Signed)
"  °  Subjective:  [name], male    DOB: 1965-12-21,  MRN: [id number]  58 y.o. male presents with chief concern of elongated, thickened, painful, discolored toenails for years. Patient has tried none. He is in a wheelchair, suffers from cerebral palsy, OSA, and has intellectual disabilities. These make him unable to reach his own nails for regular maintenance. He is present with a caregiver. He is unable to communicate his history, above is from chart review.   Chief Complaint  Patient presents with   RFc    Rm1 Routine foot care     PCP is [facility]   No Known Allergies  Review of Systems: Negative except as noted in the HPI.   Objective:  Richard Oliver is a pleasant 58 y.o. male AAO x 3.  Vascular Examination: Faintly palpable pedal pulses. CFT immediate b/l. Pedal hair present. No edema. No pain with calf compression b/l. Skin temperature gradient WNL b/l. No varicosities noted. No cyanosis or clubbing noted.  Neurological Examination: Sensation grossly intact b/l with 10 gram monofilament. Vibratory sensation intact b/l.  Dermatological Examination: Pedal skin with normal turgor, texture and tone b/l. No open wounds nor interdigital macerations noted. Toenails 1-5 b/l thick, discolored, elongated with subungual debris and pain on dorsal palpation. No hyperkeratotic lesions noted b/l.   Musculoskeletal Examination: Muscle strength 5/5 to b/l LE.  No pain, crepitus noted b/l. No gross pedal deformities.     Assessment:   1. Onychomycosis   2. Intellectual disability      Plan:  Consent given for treatment. Patient examined. All patient's and/or POA's questions/concerns addressed on today's visit.Toenails were debrided in length and girth without incident.Continue soft, supportive shoe gear daily. Report any pedal injuries to medical professional. Call office if there are any questions/concerns. -Patient/POA to call should there be question/concern in the  interim.  RTC 3 months or as needed  Prentice Ovens, DPM      [place] LOCATION: [address] [place] [address]                   Office [phone]   [facility] LOCATION: [address] [place], [place] [address] Office [phone]  "

## 2024-11-16 ENCOUNTER — Ambulatory Visit: Payer: Self-pay

## 2024-11-16 NOTE — Telephone Encounter (Signed)
 Can we get him in with Candis next week since they didn't

## 2024-11-16 NOTE — Telephone Encounter (Signed)
 FYI Only or Action Required?: Action required by provider: request for appointment and update on patient condition.  Patient is followed in Pulmonology for OSA, last seen on 09/07/2024 by [name], MD.  Called Nurse Triage reporting Fatigue difficulty sleeping .  Symptoms began several months ago.  Interventions attempted: Nothing.  Symptoms are: gradually worsening.  Triage Disposition: See Physician Within 24 Hours  Patient/caregiver understands and will follow disposition?: yes No appts at [facility]. Office. Advised that will send note over and have someone call Richard Oliver at [facility] [phone] ext [phone]        Message from Woodward A sent at 11/16/2024  2:48 PM EST  Reason for Triage: Richard Oliver from [facility] called in to schedule appt for patient but mention patient is presenting with  extreme lethargy, not sleeping well, and having trouble transferring due to pain and lethargy   Reason for Disposition  [1] MODERATE weakness (e.g., interferes with work, school, normal activities) AND [2] persists > 3 days  Answer Assessment - Initial Assessment Questions 1. DESCRIPTION: Describe how you are feeling.     Very tired doesn't want to get up or move 2. SEVERITY: How bad is it?  Can you stand and walk?     Having a hard time going to sleep and staying asleep  3. ONSET: When did these symptoms begin? (e.g., hours, days, weeks, months)     Chronic affecting mobility 4. CAUSE: What do you think is causing the weakness or fatigue? (e.g., not drinking enough fluids, medical problem, trouble sleeping)     Medical problem 6. OTHER SYMPTOMS: Do you have any other symptoms? (e.g., chest pain, fever, cough, SOB, vomiting, diarrhea, bleeding, other areas of pain)     Hard time going to sleep and staying asleep  Protocols used: Weakness (Generalized) and Fatigue-A-AH

## 2024-11-22 ENCOUNTER — Ambulatory Visit (HOSPITAL_BASED_OUTPATIENT_CLINIC_OR_DEPARTMENT_OTHER)

## 2024-11-22 ENCOUNTER — Telehealth (HOSPITAL_BASED_OUTPATIENT_CLINIC_OR_DEPARTMENT_OTHER): Payer: Self-pay

## 2024-11-22 ENCOUNTER — Encounter (HOSPITAL_BASED_OUTPATIENT_CLINIC_OR_DEPARTMENT_OTHER): Payer: Self-pay

## 2024-11-22 VITALS — BP 128/84 | HR 74 | Ht 64.0 in | Wt 176.0 lb

## 2024-11-22 DIAGNOSIS — Z9682 Presence of neurostimulator: Secondary | ICD-10-CM | POA: Diagnosis not present

## 2024-11-22 DIAGNOSIS — Z7409 Other reduced mobility: Secondary | ICD-10-CM

## 2024-11-22 DIAGNOSIS — G4733 Obstructive sleep apnea (adult) (pediatric): Secondary | ICD-10-CM | POA: Diagnosis not present

## 2024-11-22 NOTE — Progress Notes (Signed)
 "  @Patient  ID: Richard Oliver, male    DOB: Mar 16, 1966, 59 y.o.   MRN: [id number]  Chief Complaint  Patient presents with   Follow-up    Sleep Earl)      Referring provider: Royals, Philis HERO, MD  HPI: Discussed the use of AI scribe software for clinical note transcription with the patient, who gave verbal consent to proceed.  History of Present Illness Patient is a 59 y/o male resident of [facility] with PMH of severe MR, seizures, CP, OSA and obesity hypoventilation syndrome s/p Inspire device placement in 2024 who presents for follow up with concern about not sleeping well and daytime fatigue.  He is accompanied by his nurse.  She has been working with him since December as this is a new assignment.  He has been experiencing trouble sleeping for the past month, leading to increased fatigue. He feels tired constantly and struggles to engage in daily activities. His ability to stand and pivot has diminished, requiring assistance for bathroom use, often needing a urinal.  A planned awake endoscopy in December to assess his condition was unable to be completed due to being fed a meal prior to the procedure. Rescheduling has been difficult, and he continues to have issues with sleep.    There have been no changes in his medication regimen. He is currently on Farxiga and phenobarbital , with no recent dosage adjustments. He undergoes lab draws every six months to monitor phenobarbital  levels.  No abnormalities have been reported.  He resides in a group home and typically requires one-person assistance, but due to his current state, he now needs two-person assistance. He is described as showing stiffness and lack of cooperation during movements.  Notably, he has a new nurse that has been with him since the start of December.  She does not report any fevers, chills, cough, urinary incontinence, or other organic issues that could be associated with his difficulty sleeping and less cooperative  state.  Last OV 09/07/2024:  59 y.o. male with obstructive sleep apnea and obesity hypoventilation syndrome.   He resides at [facility].   He was using oxygen at night, currently off   PMH : Severe mental retardation, Seizures, Scoliosis, Glaucoma, Cerebral palsy, COVID April 2023    10/13/23 Activation visit , accompanied by caregiver and dad.  Bedtime 8:30 PM, sleep latency minimal, wakes up at 6 AM Sensation 0.6 V , functional level 0.9 V lower limit, upper limit 1.9 V  Start delay 30 minutes, pause time 15 minutes, duration 9 hours   04/2024 incoming 1.7 V [name] is a 59 year old male who presents for a titration study of his hypoglossal nerve stimulator implant. He is accompanied by Gabriella, his daytime caregiver.   He is undergoing a titration study to optimize the hypoglossal nerve stimulator implant settings. Despite feeling generally better, he experiences occasional tiredness. His sleep patterns show loud snoring, sometimes audible from another room. The device is set to turn off after nine hours, but there is concern he might sleep longer than the device's active period. He typically sleeps from 7:30 PM to 6:00 AM.   A recent sleep study revealed episodes of apnea at various stimulation levels, tested from 1.1 to 2.7. He uses the device approximately 44 hours per week, which is less than expected for consistent nine-hour nightly use. Caregivers manage the device, and there is concern about consistent nightly activation. The device uses two AA batteries, and caregivers are advised to monitor battery status.  Significant tests/ events reviewed PSG 12/28/07 >> AHI 35  CPAP 05/17/13 to 08/14/13 >> Used on 63 of 90 nights with average 2 hrs 42 min. Avearge AHI 0.7 with CPAP 17 cm H2O. ONO with RA 02/12/21 >> test time 9 hrs 50 min.  Baseline SpO2 92%, low SpO2 64%.  Spent 1 hr 30 min with SpO2 < 88%. HST 09/29/22 >> AHI 50.2, SpO2 low 59%  TEST/EVENTS :    Allergies[1]  Immunization History  Administered Date(s) Administered   Influenza Whole 07/20/2012   Influenza-Unspecified 08/02/2023    Past Medical History:  Diagnosis Date   Anisometropia    Cerebral palsy (HCC)    Glaucoma    Myopia    OSA (obstructive sleep apnea)    Quadriplegia (HCC)    Scoliosis    Seizure disorder (HCC)    Severe mental retardation    Sleep-related hypoventilation 07/08/2009    Tobacco History: Tobacco Use History[2] Counseling given: Not Answered   Outpatient Medications Prior to Visit  Medication Sig Dispense Refill   acetaminophen  (TYLENOL ) 160 MG/5ML solution Take 15 mLs by mouth every 4 (four) hours as needed for mild pain (pain score 1-3), moderate pain (pain score 4-6) or fever (Greater than 100 oral).     alum & mag hydroxide-simeth (MAALOX/MYLANTA) 200-200-20 MG/5ML suspension Take 15 mLs by mouth every 2 (two) hours as needed for indigestion or heartburn.     Artificial Tear Ointment (LACRI-LUBE OP) Apply 1 application  to eye at bedtime. Refresh 1/4 inch strip to lower lid of both eyes at bedtime for eye care     benazepril  (LOTENSIN ) 10 MG tablet Take 10 mg by mouth at bedtime.     bisacodyl  (DULCOLAX) 10 MG suppository Place 10 mg rectally daily as needed for mild constipation, moderate constipation or severe constipation.     Brinzolamide -Brimonidine  (SIMBRINZA) 1-0.2 % SUSP Place 1 drop into the right eye 3 (three) times daily. for glaucoma     chlorhexidine  (PERIDEX ) 0.12 % solution Use as directed 5 mLs in the mouth or throat every evening. For 1 min     Cholecalciferol  (VITAMIN D3) 2000 units capsule Take 4,000 Units by mouth daily.     diphenhydrAMINE  (BENADRYL ) 12.5 MG/5ML elixir Take 25 mg by mouth 4 (four) times daily as needed for allergies or itching (Nasal Congestion).     Docosanol  10 % CREA Apply 1 application  topically daily as needed (Apply to affected area of lips).     guaiFENesin  (ROBITUSSIN) 100 MG/5ML liquid Take  15 mLs by mouth every 4 (four) hours as needed for cough.     HYDROcodone -acetaminophen  (NORCO/VICODIN) 5-325 MG tablet Take 1-2 tablets by mouth every 6 (six) hours as needed for severe pain (pain score 7-10). 12 tablet 0   hydrocortisone cream 1 % Apply 1 Application topically as needed for itching (Bug bites).     ketoconazole  (NIZORAL ) 2 % cream Apply 1 Application topically every morning. Apply 1 fingertip amount to each foot     latanoprost  (XALATAN ) 0.005 % ophthalmic solution Place 1 drop into both eyes at bedtime.     LINZESS  145 MCG CAPS capsule Take 145 mcg by mouth daily. On empty stomach 3 minutes prior to meal     loperamide  (IMODIUM  A-D) 2 MG tablet Take 2 mg by mouth 4 (four) times daily as needed for diarrhea or loose stools. Not to exceed 16 mg in 24 hours     magnesium  hydroxide (MILK OF MAGNESIA) 400 MG/5ML suspension Take  30 mLs by mouth daily as needed for mild constipation.     Neomycin-Bacitracin-Polymyxin (TRIPLE ANTIBIOTIC) OINT Apply 1 Application topically 2 (two) times daily as needed (x 3 days for abrasions and Superficial lacerations may cover with band-aid/ dressing if needed).     OXYGEN Place 3 L into the nose at bedtime.     PHENobarbital  (LUMINAL) 97.2 MG tablet Take 97.2 mg by mouth 2 (two) times daily.     senna (SENOKOT) 8.6 MG tablet Take 2 tablets by mouth at bedtime.     Sodium Phosphates (ENEMA READY-TO-USE RE) Place 1 Application rectally daily as needed (Constipation). Step 3 : Insert enema per rectum of no results after step 1 and 2     timolol  (BETIMOL ) 0.5 % ophthalmic solution Place 1 drop into both eyes every morning.     Vitamins A & D (VITAMIN A & D) ointment Apply 1 Application topically 3 (three) times daily as needed (Skin irritation/ rash/ Chapped skin). Apply to affected area(s)     No facility-administered medications prior to visit.     Review of Systems: as per hpi  Constitutional:   No  weight loss, night sweats,  Fevers, chills,  fatigue, or  lassitude.  HEENT:   No headaches,  Difficulty swallowing,  Tooth/dental problems, or  Sore throat,                No sneezing, itching, ear ache, nasal congestion, post nasal drip,   CV:  No chest pain,  Orthopnea, PND, swelling in lower extremities, anasarca, dizziness, palpitations, syncope.   GI  No heartburn, indigestion, abdominal pain, nausea, vomiting, diarrhea, change in bowel habits, loss of appetite, bloody stools.   Resp: No shortness of breath with exertion or at rest.  No excess mucus, no productive cough,  No non-productive cough,  No coughing up of blood.  No change in color of mucus.  No wheezing.  No chest wall deformity  Skin: no rash or lesions.  GU: no dysuria, change in color of urine, no urgency or frequency.  No flank pain, no hematuria   MS:  No joint pain or swelling.  No decreased range of motion.  No back pain.    Physical Exam  BP 128/84   Pulse 74   Ht 5' 4 (1.626 m)   Wt 176 lb (79.8 kg)   SpO2 97%   BMI 30.21 kg/m   GEN: Awake, sitting up in wheelchair.  Responds to verbal and follows commands.     HEENT:  [place]/AT,  EACs-clear, TMs-wnl, NOSE-clear, THROAT-clear, no lesions, no postnasal drip or exudate noted.   NECK:  Supple w/ fair ROM; no JVD; normal carotid impulses w/o bruits; no thyromegaly or nodules palpated; no lymphadenopathy.    RESP  Clear  P & A; w/o, wheezes/ rales/ or rhonchi. no accessory muscle use, no dullness to percussion  CARD:  RRR, no m/r/g, no peripheral edema, pulses intact, no cyanosis or clubbing.  GI:   Soft & nt; nml bowel sounds; no organomegaly or masses detected.   Musco: Warm bil, no deformities or joint swelling noted.   Neuro: alert, no focal deficits noted.  Able to voluntarily move arms and legs; grip strength is present bilaterally  Skin: Warm, no lesions or rashes    Lab Results:  CBC    Component Value Date/Time   WBC 5.1 09/01/2023 0734   RBC 4.92 09/01/2023 0734   HGB 15.7  09/01/2023 0734   HCT 44.6 09/01/2023 0734  PLT 237 09/01/2023 0734   MCV 90.7 09/01/2023 0734   MCH 31.9 09/01/2023 0734   MCHC 35.2 09/01/2023 0734   RDW 12.3 09/01/2023 0734    BMET    Component Value Date/Time   NA 137 09/01/2023 0734   K 3.7 09/01/2023 0734   CL 105 09/01/2023 0734   CO2 22 09/01/2023 0734   GLUCOSE 104 (H) 09/01/2023 0734   BUN 7 09/01/2023 0734   CREATININE 0.66 09/01/2023 0734   CALCIUM 9.1 09/01/2023 0734   GFRNONAA >60 09/01/2023 0734    BNP No results found for: BNP  ProBNP No results found for: PROBNP  Imaging: No results found.  Administration History     None           No data to display          No results found for: NITRICOXIDE   Assessment & Plan:  59 y/o male resident of [facility] with PMH of severe MR, seizures, CP, OSA and obesity hypoventilation syndrome s/p Inspire device placement in 2024 who presents for follow up with concern about not sleeping well and daytime fatigue.  Titration recommendations from his last visit have not been attempted due to changes in staff and lack of education on how/when to do device setting changes.   Assessment & Plan OSA (obstructive sleep apnea)  S/P placement of hypoglossal nerve stimulator   Assessment & Plan Obstructive sleep apnea  Managed with hypoglossal nerve stimulator but difficulty sleeping and increased fatigue persist.  Current settings are Program B with range 0.8-1.3 on elec B 0.9 which are what he was on at his last visit.  Device usage is at 48 hours/week on average.  - Unfortunately OSA persists despite hypoglossal nerve stimulator implant and prior titration study showed apneic events at various stim levels.  Program A was ineffective 10-20% of the time.  Program B and C remain alternatives; he was switched to Program B at the last appt with goal to titrate from level 2 to 4.  He remains at level 2 presently. - Continue titration of hypoglossal nerve  stimulator settings weekly from level two to four as recommended at last visit.  Written instructions provided at this appointment. - Awake endoscopy needed to assess all levels to determine optimal settings.  Schedule awake endoscopy to evaluate for best settings. - Provided staff with instructions on adjusting device settings.  Functional decline with increased dependence in mobility Increased dependence in mobility, requiring two-person assistance. Atypical behavior noted. Decline may be related to fatigue from sleep apnea vs behavioral changes associated with new environmental changes. - Continue to monitor mobility and dependence levels.  May need workup for other organic cause (medications, infections, etc.) if condition worsens.  Appears to be selective though as patient followed commands and had intact strength during exam.   Return after completion of awake endoscopy, for Inspire.  Candis Dandy, PA-C 11/22/2024      [1] No Known Allergies [2]  Social History Tobacco Use  Smoking Status Never  Smokeless Tobacco Never   "

## 2024-11-22 NOTE — Patient Instructions (Addendum)
 Continue to use Inspire device nightly.  Switched to program B and instructed to titrate every two weeks; titrate up by one level once every two weeks to goal level of 4.   Printed instructions for these steps provided at appointment along with contact phone number for [facility] patient services.  Request placed with scheduling for next available appointment for awake endoscopy to further evaluate each sleep program to determine optimal settings.  Will call when appointment timing/date is finalized.

## 2024-11-27 NOTE — Telephone Encounter (Signed)
 Needs awake endoscopy scheduled.

## 2024-12-07 ENCOUNTER — Ambulatory Visit (HOSPITAL_BASED_OUTPATIENT_CLINIC_OR_DEPARTMENT_OTHER): Admitting: Pulmonary Disease

## 2025-01-31 ENCOUNTER — Ambulatory Visit: Admitting: Podiatry
